# Patient Record
Sex: Female | Born: 1970 | Race: White | Hispanic: No | Marital: Single | Smoking: Never smoker
Health system: Southern US, Community
[De-identification: ages and names within clinical notes are randomized; demographics above are authoritative.]

## PROBLEM LIST (undated history)

## (undated) DIAGNOSIS — I10 Essential (primary) hypertension: Secondary | ICD-10-CM

## (undated) DIAGNOSIS — Z8632 Personal history of gestational diabetes: Secondary | ICD-10-CM

## (undated) DIAGNOSIS — N200 Calculus of kidney: Secondary | ICD-10-CM

## (undated) DIAGNOSIS — R7401 Elevation of levels of liver transaminase levels: Secondary | ICD-10-CM

## (undated) DIAGNOSIS — Z87442 Personal history of urinary calculi: Secondary | ICD-10-CM

## (undated) DIAGNOSIS — R3129 Other microscopic hematuria: Secondary | ICD-10-CM

## (undated) DIAGNOSIS — N201 Calculus of ureter: Secondary | ICD-10-CM

## (undated) DIAGNOSIS — K5909 Other constipation: Secondary | ICD-10-CM

## (undated) DIAGNOSIS — E213 Hyperparathyroidism, unspecified: Secondary | ICD-10-CM

## (undated) DIAGNOSIS — K219 Gastro-esophageal reflux disease without esophagitis: Secondary | ICD-10-CM

## (undated) DIAGNOSIS — E559 Vitamin D deficiency, unspecified: Secondary | ICD-10-CM

## (undated) DIAGNOSIS — R1084 Generalized abdominal pain: Secondary | ICD-10-CM

## (undated) HISTORY — DX: Hyperparathyroidism, unspecified: E21.3

## (undated) HISTORY — DX: Personal history of gestational diabetes: Z86.32

## (undated) HISTORY — DX: Elevation of levels of liver transaminase levels: R74.01

## (undated) HISTORY — DX: Generalized abdominal pain: R10.84

## (undated) HISTORY — DX: Personal history of urinary calculi: Z87.442

## (undated) HISTORY — DX: Gastro-esophageal reflux disease without esophagitis: K21.9

## (undated) HISTORY — DX: Essential (primary) hypertension: I10

## (undated) HISTORY — DX: Vitamin D deficiency, unspecified: E55.9

## (undated) HISTORY — PX: BREAST ENHANCEMENT SURGERY: SHX7

## (undated) HISTORY — DX: Other microscopic hematuria: R31.29

## (undated) HISTORY — PX: OTHER SURGICAL HISTORY: SHX169

## (undated) HISTORY — PX: RENAL ENDOSCOPY VIA NEPHROSTOMY / PYELOSTOMY: SUR446

---

## 1998-03-24 ENCOUNTER — Inpatient Hospital Stay (HOSPITAL_COMMUNITY): Admission: AD | Admit: 1998-03-24 | Discharge: 1998-04-13 | Payer: Self-pay | Admitting: *Deleted

## 1998-05-27 ENCOUNTER — Encounter: Admission: RE | Admit: 1998-05-27 | Discharge: 1998-08-25 | Payer: Self-pay | Admitting: *Deleted

## 1998-06-16 ENCOUNTER — Emergency Department (HOSPITAL_COMMUNITY): Admission: EM | Admit: 1998-06-16 | Discharge: 1998-06-16 | Payer: Self-pay | Admitting: Emergency Medicine

## 1998-09-18 ENCOUNTER — Encounter (HOSPITAL_COMMUNITY): Admission: RE | Admit: 1998-09-18 | Discharge: 1998-12-17 | Payer: Self-pay | Admitting: *Deleted

## 1999-01-14 ENCOUNTER — Encounter (HOSPITAL_COMMUNITY): Admission: RE | Admit: 1999-01-14 | Discharge: 1999-04-14 | Payer: Self-pay | Admitting: *Deleted

## 1999-02-13 ENCOUNTER — Other Ambulatory Visit: Admission: RE | Admit: 1999-02-13 | Discharge: 1999-02-13 | Payer: Self-pay | Admitting: Obstetrics and Gynecology

## 2000-04-19 ENCOUNTER — Encounter (INDEPENDENT_AMBULATORY_CARE_PROVIDER_SITE_OTHER): Payer: Self-pay | Admitting: *Deleted

## 2000-04-19 LAB — CONVERTED CEMR LAB

## 2001-05-18 ENCOUNTER — Encounter: Admission: RE | Admit: 2001-05-18 | Discharge: 2001-05-18 | Payer: Self-pay | Admitting: Family Medicine

## 2001-06-08 ENCOUNTER — Encounter: Admission: RE | Admit: 2001-06-08 | Discharge: 2001-06-08 | Payer: Self-pay | Admitting: Family Medicine

## 2001-07-04 ENCOUNTER — Encounter: Admission: RE | Admit: 2001-07-04 | Discharge: 2001-07-04 | Payer: Self-pay | Admitting: Family Medicine

## 2001-09-14 ENCOUNTER — Other Ambulatory Visit: Admission: RE | Admit: 2001-09-14 | Discharge: 2001-09-14 | Payer: Self-pay | Admitting: *Deleted

## 2001-12-20 HISTORY — PX: BREAST ENHANCEMENT SURGERY: SHX7

## 2002-01-31 ENCOUNTER — Inpatient Hospital Stay (HOSPITAL_COMMUNITY): Admission: AD | Admit: 2002-01-31 | Discharge: 2002-01-31 | Payer: Self-pay | Admitting: *Deleted

## 2002-04-13 ENCOUNTER — Inpatient Hospital Stay (HOSPITAL_COMMUNITY): Admission: AD | Admit: 2002-04-13 | Discharge: 2002-04-16 | Payer: Self-pay | Admitting: *Deleted

## 2002-04-13 ENCOUNTER — Encounter (INDEPENDENT_AMBULATORY_CARE_PROVIDER_SITE_OTHER): Payer: Self-pay

## 2004-08-26 ENCOUNTER — Other Ambulatory Visit: Admission: RE | Admit: 2004-08-26 | Discharge: 2004-08-26 | Payer: Self-pay | Admitting: Obstetrics and Gynecology

## 2005-06-21 ENCOUNTER — Emergency Department (HOSPITAL_COMMUNITY): Admission: EM | Admit: 2005-06-21 | Discharge: 2005-06-21 | Payer: Self-pay | Admitting: Emergency Medicine

## 2005-09-08 ENCOUNTER — Emergency Department (HOSPITAL_COMMUNITY): Admission: EM | Admit: 2005-09-08 | Discharge: 2005-09-08 | Payer: Self-pay | Admitting: Emergency Medicine

## 2005-11-06 ENCOUNTER — Inpatient Hospital Stay (HOSPITAL_COMMUNITY): Admission: EM | Admit: 2005-11-06 | Discharge: 2005-11-08 | Payer: Self-pay | Admitting: Emergency Medicine

## 2005-11-26 ENCOUNTER — Ambulatory Visit (HOSPITAL_BASED_OUTPATIENT_CLINIC_OR_DEPARTMENT_OTHER): Admission: RE | Admit: 2005-11-26 | Discharge: 2005-11-26 | Payer: Self-pay | Admitting: Urology

## 2005-11-26 HISTORY — PX: OTHER SURGICAL HISTORY: SHX169

## 2006-05-01 ENCOUNTER — Emergency Department (HOSPITAL_COMMUNITY): Admission: EM | Admit: 2006-05-01 | Discharge: 2006-05-01 | Payer: Self-pay | Admitting: Emergency Medicine

## 2007-02-17 ENCOUNTER — Encounter (INDEPENDENT_AMBULATORY_CARE_PROVIDER_SITE_OTHER): Payer: Self-pay | Admitting: *Deleted

## 2007-03-16 ENCOUNTER — Ambulatory Visit: Payer: Self-pay | Admitting: Urology

## 2007-10-31 ENCOUNTER — Emergency Department (HOSPITAL_COMMUNITY): Admission: EM | Admit: 2007-10-31 | Discharge: 2007-10-31 | Payer: Self-pay | Admitting: Emergency Medicine

## 2007-12-21 HISTORY — PX: COLONOSCOPY: SHX174

## 2008-06-25 ENCOUNTER — Ambulatory Visit: Payer: Self-pay | Admitting: Gastroenterology

## 2008-06-25 DIAGNOSIS — K5900 Constipation, unspecified: Secondary | ICD-10-CM | POA: Insufficient documentation

## 2008-06-25 DIAGNOSIS — R5383 Other fatigue: Secondary | ICD-10-CM

## 2008-06-25 DIAGNOSIS — R5381 Other malaise: Secondary | ICD-10-CM | POA: Insufficient documentation

## 2008-06-25 LAB — CONVERTED CEMR LAB
T4, Total: 9.4 ug/dL (ref 5.0–12.5)
TSH: 1.29 microintl units/mL (ref 0.35–5.50)

## 2008-06-26 ENCOUNTER — Ambulatory Visit: Payer: Self-pay | Admitting: Gastroenterology

## 2008-06-26 LAB — HM COLONOSCOPY

## 2010-01-14 ENCOUNTER — Emergency Department (HOSPITAL_COMMUNITY): Admission: EM | Admit: 2010-01-14 | Discharge: 2010-01-14 | Payer: Self-pay | Admitting: Emergency Medicine

## 2010-08-28 ENCOUNTER — Ambulatory Visit: Payer: Self-pay | Admitting: Gynecology

## 2010-12-20 ENCOUNTER — Emergency Department (HOSPITAL_COMMUNITY)
Admission: EM | Admit: 2010-12-20 | Discharge: 2010-12-20 | Payer: Self-pay | Source: Home / Self Care | Admitting: Emergency Medicine

## 2011-02-08 ENCOUNTER — Emergency Department (HOSPITAL_COMMUNITY): Payer: PRIVATE HEALTH INSURANCE

## 2011-02-08 ENCOUNTER — Emergency Department (HOSPITAL_COMMUNITY)
Admission: EM | Admit: 2011-02-08 | Discharge: 2011-02-08 | Disposition: A | Discharge status: Home / Self Care | Payer: PRIVATE HEALTH INSURANCE | Attending: Emergency Medicine | Admitting: Emergency Medicine

## 2011-02-08 DIAGNOSIS — R112 Nausea with vomiting, unspecified: Secondary | ICD-10-CM | POA: Insufficient documentation

## 2011-02-08 DIAGNOSIS — M545 Low back pain, unspecified: Secondary | ICD-10-CM | POA: Insufficient documentation

## 2011-02-08 DIAGNOSIS — I10 Essential (primary) hypertension: Secondary | ICD-10-CM | POA: Insufficient documentation

## 2011-02-08 DIAGNOSIS — N12 Tubulo-interstitial nephritis, not specified as acute or chronic: Secondary | ICD-10-CM | POA: Insufficient documentation

## 2011-02-08 DIAGNOSIS — E876 Hypokalemia: Secondary | ICD-10-CM | POA: Insufficient documentation

## 2011-02-08 DIAGNOSIS — R7309 Other abnormal glucose: Secondary | ICD-10-CM | POA: Insufficient documentation

## 2011-02-08 DIAGNOSIS — Z87442 Personal history of urinary calculi: Secondary | ICD-10-CM | POA: Insufficient documentation

## 2011-02-08 DIAGNOSIS — R109 Unspecified abdominal pain: Secondary | ICD-10-CM | POA: Insufficient documentation

## 2011-02-08 LAB — COMPREHENSIVE METABOLIC PANEL
ALT: 51 U/L — ABNORMAL HIGH (ref 0–35)
AST: 76 U/L — ABNORMAL HIGH (ref 0–37)
Albumin: 3.5 g/dL (ref 3.5–5.2)
Alkaline Phosphatase: 64 U/L (ref 39–117)
BUN: 13 mg/dL (ref 6–23)
CO2: 23 mEq/L (ref 19–32)
Calcium: 8.2 mg/dL — ABNORMAL LOW (ref 8.4–10.5)
Creatinine, Ser: 0.84 mg/dL (ref 0.4–1.2)
GFR calc Af Amer: >60 mL/min (ref >60)
GFR calc non Af Amer: >60 mL/min (ref >60)
Glucose, Bld: 154 mg/dL — ABNORMAL HIGH (ref 70–99)
Sodium: 133 mEq/L — ABNORMAL LOW (ref 135–145)
Total Bilirubin: 1.9 mg/dL — ABNORMAL HIGH (ref 0.3–1.2)
Total Protein: 6.9 g/dL (ref 6.0–8.3)

## 2011-02-08 LAB — URINALYSIS, ROUTINE W REFLEX MICROSCOPIC
Bilirubin Urine: NEGATIVE
Ketones, ur: NEGATIVE mg/dL
Nitrite: NEGATIVE
Protein, ur: 100 mg/dL — AB
Specific Gravity, Urine: 1.019 (ref 1.005–1.030)
Urine Glucose, Fasting: NEGATIVE mg/dL
pH: 6 (ref 5.0–8.0)

## 2011-02-08 LAB — DIFFERENTIAL
Basophils Absolute: 0 10*3/uL (ref 0.0–0.1)
Basophils Relative: 0 % (ref 0–1)
Eosinophils Absolute: 0 10*3/uL (ref 0.0–0.7)
Eosinophils Relative: 0 % (ref 0–5)
Lymphocytes Relative: 4 % — ABNORMAL LOW (ref 12–46)
Lymphs Abs: 0.3 10*3/uL — ABNORMAL LOW (ref 0.7–4.0)
Monocytes Absolute: 0.1 10*3/uL (ref 0.1–1.0)
Monocytes Relative: 1 % — ABNORMAL LOW (ref 3–12)
Neutro Abs: 7.5 10*3/uL (ref 1.7–7.7)
Neutrophils Relative %: 96 % — ABNORMAL HIGH (ref 43–77)

## 2011-02-08 LAB — URINE MICROSCOPIC-ADD ON

## 2011-02-08 LAB — CBC
Hemoglobin: 11.1 g/dL — ABNORMAL LOW (ref 12.0–15.0)
MCH: 29.2 pg (ref 26.0–34.0)
MCHC: 31.1 g/dL (ref 30.0–36.0)
MCV: 93.9 fL (ref 78.0–100.0)
Platelets: 201 10*3/uL (ref 150–400)
RDW: 13.1 % (ref 11.5–15.5)
WBC: 7.9 10*3/uL (ref 4.0–10.5)

## 2011-02-08 LAB — LIPASE, BLOOD: Lipase: 20 U/L (ref 11–59)

## 2011-02-08 LAB — POCT PREGNANCY, URINE: Preg Test, Ur: NEGATIVE

## 2011-02-08 MED ORDER — IOHEXOL 300 MG/ML  SOLN
100.0000 mL | Freq: Once | INTRAMUSCULAR | Status: AC | PRN
  Administered 2011-02-08: 100 mL via INTRAVENOUS

## 2011-02-10 LAB — URINE CULTURE: Colony Count: >=100000

## 2011-03-07 LAB — CBC
HCT: 39.4 % (ref 36.0–46.0)
Hemoglobin: 13.5 g/dL (ref 12.0–15.0)
MCHC: 34.2 g/dL (ref 30.0–36.0)
MCV: 92.3 fL (ref 78.0–100.0)
Platelets: 262 10*3/uL (ref 150–400)
RBC: 4.27 MIL/uL (ref 3.87–5.11)
RDW: 13.2 % (ref 11.5–15.5)
WBC: 12.1 10*3/uL — ABNORMAL HIGH (ref 4.0–10.5)

## 2011-03-07 LAB — URINE MICROSCOPIC-ADD ON

## 2011-03-07 LAB — URINALYSIS, ROUTINE W REFLEX MICROSCOPIC
Bilirubin Urine: NEGATIVE
Glucose, UA: NEGATIVE mg/dL
Ketones, ur: NEGATIVE mg/dL
Nitrite: NEGATIVE
Protein, ur: NEGATIVE mg/dL
Specific Gravity, Urine: 1.012 (ref 1.005–1.030)
Urobilinogen, UA: 4 mg/dL — ABNORMAL HIGH (ref 0.0–1.0)
pH: 7 (ref 5.0–8.0)

## 2011-03-07 LAB — BASIC METABOLIC PANEL
BUN: 7 mg/dL (ref 6–23)
CO2: 26 mEq/L (ref 19–32)
Calcium: 8.3 mg/dL — ABNORMAL LOW (ref 8.4–10.5)
Chloride: 97 mEq/L (ref 96–112)
Creatinine, Ser: 0.76 mg/dL (ref 0.4–1.2)
GFR calc Af Amer: >60 mL/min (ref >60)
GFR calc non Af Amer: >60 mL/min (ref >60)
Glucose, Bld: 102 mg/dL — ABNORMAL HIGH (ref 70–99)
Potassium: 3.2 mEq/L — ABNORMAL LOW (ref 3.5–5.1)
Sodium: 132 mEq/L — ABNORMAL LOW (ref 135–145)

## 2011-03-07 LAB — DIFFERENTIAL
Basophils Absolute: 0 10*3/uL (ref 0.0–0.1)
Basophils Relative: 0 % (ref 0–1)
Eosinophils Absolute: 0 10*3/uL (ref 0.0–0.7)
Monocytes Absolute: 0.8 10*3/uL (ref 0.1–1.0)
Neutro Abs: 9.9 10*3/uL — ABNORMAL HIGH (ref 1.7–7.7)

## 2011-03-07 LAB — URINE CULTURE: Colony Count: >=100000

## 2011-05-07 NOTE — Op Note (Signed)
[facility]  Patient:    [name], [name] Visit Number: [id number] MRN: [id number]          Service Type: OBS Location: 910A 9115 01 Attending Physician:  Pleas Koch Dictated by:   Georgina Peer, M.D. Proc. Date: 04/13/02 Admit Date:  04/13/2002                             Operative Report  PREOPERATIVE DIAGNOSES:       Intrauterine pregnancy at 39 weeks, previous cesarean section, desires repeat cesarean section, postpartum tubal ligation, compensated anemia.  POSTOPERATIVE DIAGNOSES:      Intrauterine pregnancy at 39 weeks, previous cesarean section, desires repeat cesarean section, postpartum tubal ligation, compensated anemia.  OPERATION PERFORMED:          Low cervical transverse repeat cesarean section and postpartum tubal ligation.  Schetter:                      Georgina Peer, M.D.  ASSISTANT:                    Rudy Jew. Ashley Royalty, M.D.  ANESTHESIA:                   Spinal-Dr. Arby Barrette.  ESTIMATED BLOOD LOSS:         700 cc.  FINDINGS:                     An 8 pound 14 ounce female at 9:32 a.m.  Apgars 8 and 9.  Tubes and ovaries normal.  INDICATIONS:                  A 40 year old gravida 4, para 1-1-1-2 at 39 weeks declined VBAC in favor of repeat cesarean section, postpartum tubal ligation.  She understood risks and complications of repeat surgery including bleeding, infection, damage to bowel, bladder, or vascular structures, increased thrombotic risk, and adhesions.  She accepted these and was willing to proceed.  The understood the risks and complications of tubal ligation including the 3-4 per 1000 lifetime risk of tubal ligation failure by any method.  DESCRIPTION OF PROCEDURE:     Patient was taken to the operating room, given a spinal anesthetic by Dr. Arby Barrette, placed in the dorsal supine left lateral uterine displacement position.  She was prepped and draped in the normal sterile fashion.  Foley catheter  placed in her bladder.  Transverse skin incision was made through the previous incision and taken down to the peritoneal cavity in the normal Pfannenstiel manner.  Bleeders were cauterized.  Transverse bladder flap was created.  Transverse uterine incision made.  The clear fluid ______ through the incision and it was widened with bandage scissors.  At 9:32 female infant was delivered with the aid of fundal pressure.  There were no observable anomalies.  Infant cried spontaneously. Mouth and nose were suctioned.  Cord was doubly clamped and cut and the infant was handed to the pediatric team in attendance.  Cord blood was taken.  Apgars were 8 and 9.  Weight was 8 pounds 14 ounces.  Placenta and membranes were removed.  The uterus was without extension and closed and one layer of running locked Vicryl with individual sutures of Vicryl over the incisional area to control bleeding.  There was excellent hemostasis.  Attention was taken to the tubes.  The left tube was identified  at its cornua and traced to its fimbriated end.  A mid portion of knuckle was elevated and two plain sutures of 2-0 plain were placed across the knuckle of tube and the mid portion excised.  Both lumens identified.  On the right side the tube was identified, traced to its fimbriated end.  A mesosalpingeal vessel was large and coursing under the tube.  Therefore, a modified Parkland procedure was done by identifying an avascular portion of the mesosalpinx and making a cautery hole in this, placing two sutures 3 cm apart and tying then securely and excising the mid portion of tube.  There was excellent hemostasis.  At this time the uterine incision was inspected and found to be dry.  It was irrigated and all blood and debris removed.  Bladder flap was also found to be dry.  The under side of the muscle fascia and peritoneum was found to be dry.  The fascia was then closed with Vicryl suture.  The subcutaneous tissue was  closed with Vicryl suture.  Skin was closed with 4-0 Monocryl subcuticular suture.  The patient received 1 g of Ancef after the cord was clamped.  Sponge, needle, and instrument counts were correct.  The patient began the case with a hemoglobin of 8.9.  It was 9.9 approximately one month ago in spite of iron therapy.  She should be watched postoperatively for signs of symptomatic anemia. Dictated by:   Georgina Peer, M.D. Attending Physician:  Pleas Koch DD:  04/13/02 TD:  04/13/02 Job: [id number]

## 2011-05-07 NOTE — Op Note (Signed)
[name], [name]               ACCOUNT NO.:  [id number]   MEDICAL RECORD NO.:  [id number]          PATIENT TYPE:  AMB   LOCATION:  [facility]                         FACILITY:  [facility]   PHYSICIAN:  Mark C. Vernie Ammons, M.D.  DATE OF BIRTH:  1971/10/06   DATE OF PROCEDURE:  11/26/2005  DATE OF DISCHARGE:                                 OPERATIVE REPORT   PREOPERATIVE DIAGNOSES:  Right ureteral calculus.   POSTOPERATIVE DIAGNOSES:  Right ureteral calculus.   PROCEDURE:  Cystoscopy, right ureteroscopy with laser lithotripsy and double-  J stent placement.   Amodio:  Mark C. Vernie Ammons, M.D.   ANESTHESIA:  General.   SPECIMENS:  Stone to patient.   ESTIMATED BLOOD LOSS:  Minimal.   DRAINS:  4.7 French 24 cm double-J stent with string   COMPLICATIONS:  None.   INDICATIONS:  The patient is a 40 year old white female with a 7 mm stone in  her distal right ureter. It originally had resulted in obstruction and  pyelonephritis. She had a stent placed emergently and was treated with a  full course of antibiotics. She now returns for treatment of her stone. The  risks, complications and alternatives have been discussed with the patient.  She understands and has elected to proceed.   DESCRIPTION OF OPERATION:  After informed consent, the patient was brought  to the major OR, placed on the table, administered general anesthesia and  then moved to the dorsal position. Her genitalia was sterilely prepped and  draped. A 22-French cystoscope with a 12 degree lens was inserted in the  bladder. The stent was identified and grasped with alligator graspers and  drawn part way out the urethral meatus. A 0.038 inch floppy tip guidewire  was then passed up the stent and into the area of the right renal pelvis  under direct fluoroscopic control and the stent was removed.   Next to the guidewire, a 6-French short rigid ureteroscope was then passed  under direct visualization and the stone was identified.  I then removed the  guidewire and used the 360 micron holmium laser fiber to fragment the stone.  Once the stone was completely fragmented, the nitinol basket was then used  to retrieve all of the bits of the stone.   I then reinserted the guidewire under fluoroscopic control and removed the  ureteroscope. Backloading the cystoscope over the guidewire, I passed a  double-J stent over the guidewire into the renal pelvis. As the guidewire  was removed, good curl was noted in the renal pelvis and in the bladder. The  stones were collected and will be given to the patient. A string was left  affixed to the end of the stent and was affixed to the inner thigh. The  patient was  awakened and taken to the recovery room in stable satisfactory condition.  She tolerated the procedure well. There were no intraoperative  complications. She did receive a B&O suppository intraoperatively and will  be discharged home from the recovery room with instructions and follow-up  the first of next week to have her stent removed.  Mark C. Vernie Ammons, M.D.  Electronically Signed     MCO/MEDQ  D:  11/26/2005  T:  11/26/2005  Job:  [id number]

## 2011-05-07 NOTE — Op Note (Signed)
NAMEILLANA, [name]               ACCOUNT NO.:  [id number]   MEDICAL RECORD NO.:  [id number]          PATIENT TYPE:  INP   LOCATION:  1429                         FACILITY:  [facility]   PHYSICIAN:  Mark C. Vernie Ammons, M.D.  DATE OF BIRTH:  1971-12-11   DATE OF PROCEDURE:  11/06/2005  DATE OF DISCHARGE:                                 OPERATIVE REPORT   PREOPERATIVE DIAGNOSES:  1.  Right ureteral calculus.  2.  Hydronephrosis.  3.  Pyelonephritis.   POSTOPERATIVE DIAGNOSES:  1.  Right ureteral calculus.  2.  Hydronephrosis.  3.  Pyelonephritis.   PROCEDURE:  Cystoscopy and double-J stent placement.   Widmayer:  Mark C. Vernie Ammons, M.D.   ANESTHESIA:  General.   BLOOD LOSS:  Zero.   DRAINS:  6-French 24 cm double-J stent in the right ureter (no string).   SPECIMENS:  Urine for culture.   COMPLICATIONS:  None.   INDICATIONS:  The patient is a 40 year old white female who began to have  pain in her right lower quadrant 24 hours ago. It has progressed and  associated with fever with 104, chills, nausea and vomiting. She was found  on CT scan to have marked hydronephrosis on the right-hand side down to a  large stone at the UVJ measuring approximately 8 x 7 x 6 mm. Her urinalysis  was nitrite positive with 7-10 white cells and her white count was elevated  to 17.9. The need for emergent stent placement was discussed with the  patient, she understands and elected to proceed.   DESCRIPTION OF OPERATION:  After informed consent, the patient was brought  to the major OR, placed on the table, administered general anesthesia and  then moved to the dorsal lithotomy position. Her genitalia was sterilely  prepped and draped and a 21-French cystoscope with a 12 degree lens was  inserted in the bladder. The bladder was noted be free of any tumor, stones  or inflammatory lesions.   The right ureteral orifice was identified and a 0.038 inch floppy-tip  Glidewire was then passed up the right  ureter easily pass the stone and into  the area of the renal pelvis. I then passed the stent pusher over the  guidewire past the stone and collected urine for culture. The guidewire was  then replaced and the double-J stent passed over the guidewire into the  renal pelvis. As the guidewire was removed, good curl was noted in the renal  pelvis and then in the bladder. The bladder was completely drained, the  cystoscope was removed and a B&O suppository was administered.   The patient received 3 grams of Unasyn and 80 mg of gentamicin  preoperatively. She will be maintained on broad-spectrum antibiotics  postoperatively and observed in the hospital.      Loraine Leriche C. Vernie Ammons, M.D.  Electronically Signed     MCO/MEDQ  D:  11/06/2005  T:  11/08/2005  Job:  [id number]

## 2011-05-07 NOTE — Discharge Summary (Signed)
[facility]  Patient:    [name], [name] Visit Number: [id number] MRN: [id number]          Service Type: OBS Location: 910A 9115 01 Attending Physician:  Pleas Koch Dictated by:   Georgina Peer, M.D. Admit Date:  04/13/2002 Discharge Date: 04/16/2002                             Discharge Summary  ADMISSION DIAGNOSES:          1. Term intrauterine pregnancy.                               2. Previous cesarean section.                               3. Desires sterilization.  DISCHARGE DIAGNOSES:          1. Term intrauterine pregnancy.                               2. Previous cesarean section.                               3. Desires sterilization.                               4. Compensated anemia.  HISTORY OF PRESENT ILLNESS:   The patient is a 40 year old female with a previous C section, desired repeat C section and postpartum tubal ligation. See History & Physical for details.  HOSPITAL COURSE:              She was admitted and underwent a repeat low transverse cesarean section and postpartum tubal ligation with an 8 pound 14 ounce female infant born.  The operation was uneventful.  Her preoperative hemoglobin was 8.9, postoperative was 7.5 on the first day and 7.3 on the second day.  She had no dizziness or lightheadedness, no excessive bleeding.  She was nursing without difficulty.  She ambulated without problems.  She was switched to p.o. pain medicine on the first day.  She passed gas and voided on the first day.  Her diet was advanced to regular on the first day.  On the second day, she was eating full regular diet and ambulating and voiding.  She again was not lightheaded or dizzy.  She continued to nurse well.  Her wound was clean and dry.  She was discharged on the third postoperative day with a normal exam, afebrile, with normal vital signs.  DISCHARGE MEDICATIONS: She was given a prescription for Darvocet-N 100 1 to  2 q.4-6h. p.r.n., iron which she had at home, Paxil 30 mg daily which she took during the pregnancy, and prenatal vitamins.  FOLLOWUP:  She will follow up in the office in two weeks.  DISCHARGE INSTRUCTIONS:  She was given a discharge booklet with specific instructions on postpartum concerns and postoperative routines. Dictated by:   Georgina Peer, M.D. Attending Physician:  Pleas Koch DD:  04/16/02 TD:  04/16/02 Job: [id number]

## 2011-05-07 NOTE — Discharge Summary (Signed)
NAMEKEREN, ALVERIO               ACCOUNT NO.:  [id number]   MEDICAL RECORD NO.:  [id number]          PATIENT TYPE:  INP   LOCATION:  1429                         FACILITY:  [facility]   PHYSICIAN:  Mark C. Vernie Ammons, M.D.  DATE OF BIRTH:  1971-06-24   DATE OF ADMISSION:  11/06/2005  DATE OF DISCHARGE:  11/08/2005                                 DISCHARGE SUMMARY   PRIMARY DIAGNOSES:  1.  Right pyelonephritis.  2.  Right hydronephrosis.  3.  Right ureteral calculus.  4.  Renal cysts.   PRINCIPAL OPERATION:  Cystoscopy and right double-J stent placement.   DISPOSITION:  The patient is discharged home in stable, satisfactory, and  improved condition.  She is afebrile, tolerating a regular diet.  She has  had some mild irritative symptoms which will be managed with  anticholinergics.   FOLLOW UP:  Her followup will be in my office in one week.   DISCHARGE MEDICATIONS:  1.  Darvocet one-two q.4h. p.r.n. pain #38 no refill.  2.  Vesicare 10 mg one daily p.r.n. bladder irritation #24 no refill.   DIET/ACTIVITY:  Unrestricted as well as her activity.   BRIEF HISTORY:  The patient is a 40 year old white female who was seen in  the emergency room with a temperature to 100.4 and right flank pain,  which  was mild but she was also having significant right lower quadrant pain.  It  was of sudden onset and associated with fever, chills, nausea, and vomiting.  She had no prior history of stones.  She had a similar episode about a month  ago, which resolved spontaneously.  She has been unable to keep anything  down by mouth, is afebrile, and has not seen any hematuria or pain in her  voiding pattern.  The remainder of her health history is noted in the  hospital chart and will not be repeated at this time.   Upon arrival to the emergency room laboratory results and a CT scan were  obtained.  Her urine had 7-10 white cells, it was nitrite positive, and 0-2  red cells were seen.  Her white count  was 17.9 with an H&H of 12.8 and 37.6.  Her electrolytes were normal with a slightly low sodium.  Her creatinine was  1.2.  Her SGOT and SGPT were slightly elevated at 61 and 83 respectively.  She also had a CT scan that revealed a 6x 7 x 8 mm stone just above the  right UVJ.  There was marked hydronephrosis and what appeared to be some  small renal cysts.  However, the radiologist felt that these could possibly  represent abscesses.  She also had a right renal calculus as well.   She was taken to the operating room emergently and had a right double-J  stent placed.  The urine was cultured when she arrived in the emergency  room.  I also obtained a culture from above the stone her hydronephrotic  ureter.  She was started on broad-spectrum antibiotics with Zosyn and  gentamicin and promptly defervesced.  Her white count, though, did peak at  22.3 the following day. Creatinine remained stable at 1.2 and her SGOT  decreased to 51, SGPT to 34.  She continued to do well and her Foley was  eventually removed.  By her second postoperative day she had no complaints.  She had no fever for 48 hours.  Her vital signs were stable.  Her creatinine  was now 1.0.  Her white count had decreased to 18.1 and she had no flank  pain, although she had some irritative voiding symptoms.  Her urine culture  continued to remain pending.  A renal ultrasound was checked and revealed  significant decrease in the right hydronephrosis in the areas that were felt  to be possible cysts were confirmed to be simple cysts and not absences.  She, therefore, felt ready to be discharged at this time despite the fact  that her culture remained pending.   PLAN:  The plan will be to discharge her home with Levaquin 750 mg.  She is  going to take a single dose now and then in the morning.  I will follow up  on her cultures and contact her tomorrow with the culture results and adjust  antibiotics accordingly treating based on  sensitivities.  She is having some  irritative symptoms and I have therefore given her a prescription for  Vesicare for that.  She will take that on an as needed basis to be seen as  noted above.      Mark C. Vernie Ammons, M.D.  Electronically Signed     MCO/MEDQ  D:  11/08/2005  T:  11/09/2005  Job:  [id number]

## 2011-08-05 ENCOUNTER — Encounter (HOSPITAL_COMMUNITY): Payer: Self-pay

## 2011-08-05 ENCOUNTER — Inpatient Hospital Stay (HOSPITAL_COMMUNITY): Payer: PRIVATE HEALTH INSURANCE

## 2011-08-05 ENCOUNTER — Inpatient Hospital Stay (HOSPITAL_COMMUNITY)
Admission: EM | Admit: 2011-08-05 | Discharge: 2011-08-09 | DRG: 872 | Disposition: A | Discharge status: Home / Self Care | Payer: PRIVATE HEALTH INSURANCE | Attending: Urology | Admitting: Urology

## 2011-08-05 ENCOUNTER — Emergency Department (HOSPITAL_COMMUNITY): Payer: PRIVATE HEALTH INSURANCE

## 2011-08-05 DIAGNOSIS — A419 Sepsis, unspecified organism: Secondary | ICD-10-CM | POA: Diagnosis present

## 2011-08-05 DIAGNOSIS — Z87442 Personal history of urinary calculi: Secondary | ICD-10-CM

## 2011-08-05 DIAGNOSIS — N390 Urinary tract infection, site not specified: Secondary | ICD-10-CM | POA: Diagnosis present

## 2011-08-05 DIAGNOSIS — I10 Essential (primary) hypertension: Secondary | ICD-10-CM | POA: Diagnosis present

## 2011-08-05 DIAGNOSIS — IMO0002 Reserved for concepts with insufficient information to code with codable children: Secondary | ICD-10-CM | POA: Diagnosis present

## 2011-08-05 DIAGNOSIS — N201 Calculus of ureter: Secondary | ICD-10-CM | POA: Diagnosis present

## 2011-08-05 DIAGNOSIS — A4159 Other Gram-negative sepsis: Principal | ICD-10-CM | POA: Diagnosis present

## 2011-08-05 DIAGNOSIS — R0602 Shortness of breath: Secondary | ICD-10-CM

## 2011-08-05 HISTORY — PX: OTHER SURGICAL HISTORY: SHX169

## 2011-08-05 LAB — DIFFERENTIAL
Basophils Absolute: 0 10*3/uL (ref 0.0–0.1)
Lymphocytes Relative: 2 % — ABNORMAL LOW (ref 12–46)
Lymphs Abs: 0.3 10*3/uL — ABNORMAL LOW (ref 0.7–4.0)
Neutro Abs: 12 10*3/uL — ABNORMAL HIGH (ref 1.7–7.7)
Neutrophils Relative %: 94 % — ABNORMAL HIGH (ref 43–77)

## 2011-08-05 LAB — URINALYSIS, ROUTINE W REFLEX MICROSCOPIC
Bilirubin Urine: NEGATIVE
Ketones, ur: NEGATIVE mg/dL
Protein, ur: 100 mg/dL — AB
Urobilinogen, UA: 0.2 mg/dL (ref 0.0–1.0)

## 2011-08-05 LAB — BASIC METABOLIC PANEL
BUN: 25 mg/dL — ABNORMAL HIGH (ref 6–23)
CO2: 24 mEq/L (ref 19–32)
Chloride: 102 mEq/L (ref 96–112)
Creatinine, Ser: 1 mg/dL (ref 0.50–1.10)
GFR calc Af Amer: >60 mL/min (ref >60)
Potassium: 4 mEq/L (ref 3.5–5.1)

## 2011-08-05 LAB — URINE MICROSCOPIC-ADD ON

## 2011-08-05 LAB — CBC
HCT: 35.8 % — ABNORMAL LOW (ref 36.0–46.0)
Hemoglobin: 11.4 g/dL — ABNORMAL LOW (ref 12.0–15.0)
MCV: 91.6 fL (ref 78.0–100.0)
RBC: 3.91 MIL/uL (ref 3.87–5.11)
WBC: 12.8 10*3/uL — ABNORMAL HIGH (ref 4.0–10.5)

## 2011-08-05 LAB — MRSA PCR SCREENING: MRSA by PCR: NEGATIVE

## 2011-08-05 LAB — LACTIC ACID, PLASMA
Lactic Acid, Venous: 2.9 mmol/L — ABNORMAL HIGH (ref 0.5–2.2)
Lactic Acid, Venous: 2.1 mmol/L (ref 0.5–2.2)

## 2011-08-06 ENCOUNTER — Inpatient Hospital Stay (HOSPITAL_COMMUNITY): Payer: PRIVATE HEALTH INSURANCE

## 2011-08-06 LAB — CBC
HCT: 30 % — ABNORMAL LOW (ref 36.0–46.0)
Hemoglobin: 9.7 g/dL — ABNORMAL LOW (ref 12.0–15.0)
MCHC: 32.3 g/dL (ref 30.0–36.0)
RDW: 13.2 % (ref 11.5–15.5)
WBC: 18.1 10*3/uL — ABNORMAL HIGH (ref 4.0–10.5)

## 2011-08-06 LAB — BASIC METABOLIC PANEL
Calcium: 7.9 mg/dL — ABNORMAL LOW (ref 8.4–10.5)
GFR calc non Af Amer: >60 mL/min (ref >60)
Glucose, Bld: 118 mg/dL — ABNORMAL HIGH (ref 70–99)
Potassium: 3.7 mEq/L (ref 3.5–5.1)
Sodium: 135 mEq/L (ref 135–145)

## 2011-08-06 LAB — LACTIC ACID, PLASMA: Lactic Acid, Venous: 1.1 mmol/L (ref 0.5–2.2)

## 2011-08-06 NOTE — H&P (Signed)
[name], [name] NO.:  [id number]  MEDICAL RECORD NO.:  [id number]  LOCATION:  1233                         FACILITY:  [facility]  PHYSICIAN:  Natalia Leatherwood, MD    DATE OF BIRTH:  [birth year]  DATE OF ADMISSION:  08/05/2011 DATE OF DISCHARGE:                             HISTORY & PHYSICAL   CHIEF COMPLAINT:  Left flank pain.  HISTORY OF PRESENT ILLNESS:  This is a pleasant 40 year old female who has a history of urolithiasis.  She woke up early this morning with nausea, emesis, and left-sided flank and abdominal pain.  She presented to the [facility] ER where CT scan was obtained, which showed bilateral obstructing ureteral stones at the ureteropelvic junction.  I was consulted and was able to review the CT scan.  Upon my arrival in the ER, noted that the patient met at least 3 SIRS criteria with respiration rate of 20, tachycardia greater than 100, and a white blood cell count of 12.  After a lactate be obtained and I discussed with the patient the options for management.  Urinalysis was reviewed and it had signs of infection.  We asked that this be sent for culture and it was.  The patient has had stones before.  She said the pain she has is sharp in nature.  It is better if she lays on her right side.  Nothing seems to make it worse.  It was associated with nausea.  It was associated with emesis.  Not associated with gross hematuria.  She has had a pain similar to this before.  She denies fever associated with it.  PAST MEDICAL HISTORY: 1. Hypertension. 2. Urolithiasis.  PAST SURGICAL HISTORY: 1. C-section. 2. Tubal ligation. 3. Breast augmentation. 4. Lithotripsy.  ALLERGIES:  CODEINE, She has "throat swelling."  SOCIAL HISTORY:  She denies smoking.  Alcohol, she has 2-3 drinks per month.  She denies recreational drugs.  MEDICATIONS:  She takes over-the-counter vitamin C.  FAMILY HISTORY:  Grandmother with colon cancer.  REVIEW OF SYSTEMS:   Positive for anxiety, shortness of breath 1 month ago which did not persist, and constipation.  Further 10-point review of systems is negative.  PHYSICAL EXAMINATION:  VITAL SIGNS:  Pulse 103, systolic blood pressure of 120, respirations 20, temperature 100.7. GENERAL:  The patient in moderate distress from pain.  The patient also has rigors. HEENT:  Extraocular muscles are intact.  Normocephalic, atraumatic. CHEST:  Equal effort bilaterally.  Clear auscultation bilaterally. CARDIOVASCULAR:  Tachycardia.  Regular rhythm. ABDOMEN:  Soft.  Mild-to-moderate tenderness to palpation in the left upper quadrant. BACK:  No CVA tenderness on the right.  No CVA tenderness on the left. EXTREMITIES:  No swelling.  No gross deformities. SKIN:  No rash.  No itching. NEUROLOGIC:  She is awake.  Alert.  Oriented.  Neurologically, motor is grossly intact in bilateral upper and lower extremities.  Sensory grossly intact in bilaterally upper and lower extremities. LYMPHATICS:  No cervical lymphadenopathy.  No supraclavicular adenopathy.  ASSESSMENT:  This is a patient with obstructing stones and systemic inflammatory response syndrome criteria.  PLAN: 1. I have discussed with the patient the need for an obstruction of  her stones.  I explained the manipulation of the stones further to     try to remove them could produce worsening of her possible sepsis. 2. I explained the risks and benefits, which include, but are not     limited to bleeding, infection, allergic reaction, problems with     heart, blood pressure, death, and need for further procedure.  She     voiced understanding. 3. Informed consent was obtained and we will take her to the operating     room for ureteral stent placement.          ______________________________ Natalia Leatherwood, MD     DW/MEDQ  D:  08/05/2011  T:  08/06/2011  Job:  [id number]  Electronically Signed by Natalia Leatherwood MD on 08/06/2011 08:15:06 AM

## 2011-08-06 NOTE — Op Note (Signed)
  [name], DAPONTE NO.:  [id number]  MEDICAL RECORD NO.:  [id number]  LOCATION:  1233                         FACILITY:  [facility]  PHYSICIAN:  Natalia Leatherwood, MD    DATE OF BIRTH:  1971-02-04  DATE OF PROCEDURE:  08/05/2011 DATE OF DISCHARGE:                              OPERATIVE REPORT   PREOPERATIVE DIAGNOSIS:  Bilateral obstructing nephrolithiasis.  POSTOPERATIVE DIAGNOSIS:  Bilateral obstructing nephrolithiasis.  PROCEDURE PERFORMED:  Cystoscopy, bilateral ureteral stent placement.  FINDINGS:  Bilateral nephrolithiasis.  SPECIMEN:  None.  ESTIMATED BLOOD LOSS:  None.  COMPLICATIONS:  None.  HISTORY OF PRESENT ILLNESS:  This is a 40 year old female who presented to the emergency department in pain in the left abdomen.  She met SIRS criteria and had CT scan, was found to have bilateral obstructing nephrolithiasis.  Urology is consulted and I recommended ureteral stent placement to allow drainage of her kidneys.  We discussed risk and benefits which include but were not limited to bleeding, infection, sepsis, damage to contiguous structures, need for further surgery in the future, and death.  The patient voiced understanding, wishes to proceed.  PROCEDURE IN DETAIL:  After informed consent was obtained, the patient taken to the operating room, where she was placed supine position.  IV antibiotics had been given in the ER prior to her being taken to the OR.  General anesthesia was induced and then she was placed in dorsal lithotomy position, making sure to pad all pertinent neurovascular pressure points.  Her genitals were then prepped and draped in usual fashion.  Cystoscope was advanced through the urethra into the bladder.  The urine was very cloudy and this was drained and then a Sensor tip wire was placed into the right ureteral orifice and placed up past the stone into the right kidney under fluoroscopy.  A 6 x 24 double-J ureteral stent was  placed up over the wire with ease and deployed with a good curl in the right kidney and in the bladder.  Attention was then turned to the left ureteral orifice, it was cannulated with a Sensor tip wire and this was placed with a good curl past the stone into the left renal pelvis.  The ureteral stent was then placed.  It was a 6 x 24 double-J ureteral stent.  This was placed with ease with good curl noted in renal pelvis on the left on fluoroscopy and in the bladder. The bladder was then drained.  A 10 cc of lidocaine jelly was placed in the urethra and then a 16-French Foley catheter was placed.  This completed the procedure.  Anesthesia was reversed.  She was placed back in supine position and taken to the PACU in stable condition.  I will contact critical care service for additional help managing this patient who meets SIRS criteria.          ______________________________ Natalia Leatherwood, MD     DW/MEDQ  D:  08/05/2011  T:  08/05/2011  Job:  [id number]  Electronically Signed by Natalia Leatherwood MD on 08/06/2011 08:14:07 AM

## 2011-08-07 LAB — CBC
HCT: 30 % — ABNORMAL LOW (ref 36.0–46.0)
Hemoglobin: 9.7 g/dL — ABNORMAL LOW (ref 12.0–15.0)
MCH: 30.1 pg (ref 26.0–34.0)
MCHC: 32.3 g/dL (ref 30.0–36.0)
MCV: 93.2 fL (ref 78.0–100.0)
RBC: 3.22 MIL/uL — ABNORMAL LOW (ref 3.87–5.11)

## 2011-08-07 LAB — COMPREHENSIVE METABOLIC PANEL
ALT: 16 U/L (ref 0–35)
BUN: 8 mg/dL (ref 6–23)
CO2: 24 mEq/L (ref 19–32)
Calcium: 8.2 mg/dL — ABNORMAL LOW (ref 8.4–10.5)
Creatinine, Ser: 1.03 mg/dL (ref 0.50–1.10)
GFR calc Af Amer: >60 mL/min (ref >60)
GFR calc non Af Amer: 60 mL/min — ABNORMAL LOW (ref >60)
Glucose, Bld: 148 mg/dL — ABNORMAL HIGH (ref 70–99)
Total Protein: 5.9 g/dL — ABNORMAL LOW (ref 6.0–8.3)

## 2011-08-07 LAB — URINE CULTURE: Culture  Setup Time: 201208170038

## 2011-08-08 LAB — GENTAMICIN LEVEL, PEAK: Gentamicin Pk: 4.8 ug/mL — ABNORMAL LOW (ref 5.0–10.0)

## 2011-08-08 LAB — BASIC METABOLIC PANEL
BUN: 5 mg/dL — ABNORMAL LOW (ref 6–23)
Chloride: 104 mEq/L (ref 96–112)
GFR calc non Af Amer: >60 mL/min (ref >60)
Glucose, Bld: 112 mg/dL — ABNORMAL HIGH (ref 70–99)
Potassium: 3.3 mEq/L — ABNORMAL LOW (ref 3.5–5.1)
Sodium: 136 mEq/L (ref 135–145)

## 2011-08-08 LAB — CBC
MCH: 29.6 pg (ref 26.0–34.0)
MCHC: 32.4 g/dL (ref 30.0–36.0)
Platelets: 199 10*3/uL (ref 150–400)
RBC: 3.11 MIL/uL — ABNORMAL LOW (ref 3.87–5.11)

## 2011-08-08 LAB — GENTAMICIN LEVEL, TROUGH: Gentamicin Trough: 0.9 ug/mL (ref 0.5–2.0)

## 2011-08-09 LAB — BASIC METABOLIC PANEL
BUN: 5 mg/dL — ABNORMAL LOW (ref 6–23)
CO2: 24 mEq/L (ref 19–32)
Calcium: 8.5 mg/dL (ref 8.4–10.5)
Glucose, Bld: 93 mg/dL (ref 70–99)
Potassium: 3.5 mEq/L (ref 3.5–5.1)
Sodium: 136 mEq/L (ref 135–145)

## 2011-08-09 LAB — CBC
HCT: 30.2 % — ABNORMAL LOW (ref 36.0–46.0)
Hemoglobin: 9.9 g/dL — ABNORMAL LOW (ref 12.0–15.0)
MCH: 29.5 pg (ref 26.0–34.0)
MCHC: 32.8 g/dL (ref 30.0–36.0)
RBC: 3.36 MIL/uL — ABNORMAL LOW (ref 3.87–5.11)

## 2011-08-12 LAB — CULTURE, BLOOD (ROUTINE X 2)
Culture  Setup Time: 201208172003
Culture: NO GROWTH

## 2011-08-13 NOTE — Discharge Summary (Signed)
  [name], [name] NO.:  [id number]  MEDICAL RECORD NO.:  [id number]  LOCATION:  1516                         FACILITY:  [facility]  PHYSICIAN:  Natalia Leatherwood, MD    DATE OF BIRTH:  1971-09-25  DATE OF ADMISSION:  08/05/2011 DATE OF DISCHARGE:  08/09/2011                              DISCHARGE SUMMARY   ADMISSION DIAGNOSES:  Sepsis and bilateral urinary tract obstruction with nephrolithiasis.  DISCHARGE DIAGNOSES:  Sepsis and bilateral urinary tract obstruction with nephrolithiasis.  CONSULTATIONS OBTAINED:  During this hospitalization are consultation to critical care for co-management while the patient was in the ICU due to sepsis.  PROCEDURE PERFORMED:  During this hospitalization are August 05, 2011, the patient had a cystoscopy with bilateral ureteral stent placement.  HISTORY OF PRESENT ILLNESS:  This is a pleasant female, who presented to the emergency department, who met SIRS criteria and had bilaterally obstructing stones.  Urology was consulted.  HOSPITAL COURSE:  The patient was admitted postoperatively after ureteral stents were placed.  She was admitted to the ICU due to her meeting criteria for systemic inflammatory response syndrome.  It was felt that she had early onset of sepsis and Critical Care was consulted for co-management.  The patient was in the ICU for several days requiring IV antibiotics.  She did spike several fevers and required large amount of IV fluids for supportive care.  She was initially able to be afebrile for a 24-hour period and then she was switched to oral antibiotics after being afebrile.  Also at this time, she is able to be transferred from the ICU to the floor.  The oral antibiotics were changed to Bactrim as this was what the urine culture grew out from the emergency department.  This grew out Klebsiella pneumonia.  The patient did well and is able to have her IV saline locked.  Her catheter was removed.  She  is able to void.  She is able to ambulate, control her pain with pain medications, and tolerate regular diet.  After she had been afebrile for over 24 hours while on oral antibiotics, it was felt that she could be discharged home.  DISCHARGE DISPOSITION:  Home.  DISCHARGE CONDITION:  Stable.  DISCHARGE DIET:  As before.  DISCHARGE MEDICATIONS:  She was given Bactrim Double Strength to take twice daily, hydrocodone/acetaminophen for pain, docusate sodium for stool softeners, and promethazine rectal suppositories for nausea.  She is to follow up with Dr. Margarita Grizzle at [facility] in 2 weeks' time.  She is to return to the emergency department immediately should she experience temperature to 101 or greater.  All of her questions were answered.          ______________________________ Natalia Leatherwood, MD     DW/MEDQ  D:  08/09/2011  T:  08/10/2011  Job:  [id number]  Electronically Signed by Natalia Leatherwood MD on 08/13/2011 10:18:44 PM

## 2011-08-26 ENCOUNTER — Other Ambulatory Visit: Payer: Self-pay | Admitting: Urology

## 2011-08-26 DIAGNOSIS — N200 Calculus of kidney: Secondary | ICD-10-CM

## 2011-08-30 ENCOUNTER — Other Ambulatory Visit: Payer: Self-pay | Admitting: Urology

## 2011-08-30 ENCOUNTER — Encounter (HOSPITAL_COMMUNITY): Payer: PRIVATE HEALTH INSURANCE

## 2011-08-30 LAB — CBC
HCT: 36.3 % (ref 36.0–46.0)
Hemoglobin: 11.4 g/dL — ABNORMAL LOW (ref 12.0–15.0)
MCH: 28.7 pg (ref 26.0–34.0)
MCHC: 31.4 g/dL (ref 30.0–36.0)
MCV: 91.4 fL (ref 78.0–100.0)
Platelets: 318 10*3/uL (ref 150–400)
RBC: 3.97 MIL/uL (ref 3.87–5.11)
RDW: 13.7 % (ref 11.5–15.5)
WBC: 7.3 10*3/uL (ref 4.0–10.5)

## 2011-08-30 LAB — SURGICAL PCR SCREEN
MRSA, PCR: NEGATIVE
Staphylococcus aureus: NEGATIVE

## 2011-08-30 LAB — HCG, SERUM, QUALITATIVE: Preg, Serum: NEGATIVE

## 2011-09-02 ENCOUNTER — Ambulatory Visit (HOSPITAL_COMMUNITY): Payer: PRIVATE HEALTH INSURANCE

## 2011-09-02 ENCOUNTER — Ambulatory Visit (HOSPITAL_COMMUNITY)
Admission: RE | Admit: 2011-09-02 | Discharge: 2011-09-02 | Disposition: A | Discharge status: Home / Self Care | Payer: PRIVATE HEALTH INSURANCE | Source: Ambulatory Visit | Attending: Urology | Admitting: Urology

## 2011-09-02 ENCOUNTER — Other Ambulatory Visit: Payer: Self-pay | Admitting: Urology

## 2011-09-02 ENCOUNTER — Other Ambulatory Visit (HOSPITAL_COMMUNITY): Payer: PRIVATE HEALTH INSURANCE

## 2011-09-02 DIAGNOSIS — N200 Calculus of kidney: Secondary | ICD-10-CM

## 2011-09-02 MED ORDER — IOHEXOL 300 MG/ML  SOLN
50.0000 mL | Freq: Once | INTRAMUSCULAR | Status: AC | PRN
  Administered 2011-09-02: 9 mL

## 2011-09-03 ENCOUNTER — Ambulatory Visit (HOSPITAL_COMMUNITY)
Admission: RE | Admit: 2011-09-03 | Discharge: 2011-09-04 | Disposition: A | Discharge status: Home / Self Care | Payer: PRIVATE HEALTH INSURANCE | Source: Ambulatory Visit | Attending: Urology | Admitting: Urology

## 2011-09-03 ENCOUNTER — Ambulatory Visit (HOSPITAL_COMMUNITY): Payer: PRIVATE HEALTH INSURANCE

## 2011-09-03 DIAGNOSIS — K219 Gastro-esophageal reflux disease without esophagitis: Secondary | ICD-10-CM | POA: Insufficient documentation

## 2011-09-03 DIAGNOSIS — N200 Calculus of kidney: Secondary | ICD-10-CM | POA: Insufficient documentation

## 2011-09-03 HISTORY — PX: PERCUTANEOUS NEPHROSTOLITHOTOMY: SHX2207

## 2011-09-07 NOTE — Op Note (Signed)
NAMERUBI, TOOLEY               ACCOUNT NO.:  [id number]  MEDICAL RECORD NO.:  [id number]  LOCATION:  1444                         FACILITY:  [facility]  PHYSICIAN:  Kedarius Aloisi C. Vernie Ammons, M.D.  DATE OF BIRTH:  08-31-1971  DATE OF PROCEDURE:  09/03/2011 DATE OF DISCHARGE:                              OPERATIVE REPORT   DATE OF OPERATION:  September 03, 2011.  PREOPERATIVE DIAGNOSES: 1. Right renal calculus. 2. Right double-J stent.  POSTOPERATIVE DIAGNOSES: 1. Right renal calculus. 2. Right double-J stent.  PROCEDURE: 1. Right percutaneous nephrostolithotomy (2.5 cm). 2. Removal of right double-J stent.  Esther:  Gaston Dase C. Vernie Ammons, M.D.  ANESTHESIA:  General.  SPECIMENS:  Stone, given to the patient.  DRAINS:  None.  BLOOD BLOOD LOSS:  75 cc.  COMPLICATIONS:  None.  INDICATIONS:  Patient is a 40 year old female, who was admitted to the hospital with obstructing stone and fever.  She was found to have bilateral large renal calculi and had bilateral double-J stents placed. We discussed the treatment options and she has elected to proceed with percutaneous nephrostolithotomy.  The risks, complications, and alternatives have been discussed.  DESCRIPTION OF OPERATION:  After informed consent, the patient was brought to the major OR, administered general anesthesia, and then moved to the prone position on the operating table.  Her right flank was noted to have the previously placed nephrostomy catheter exiting and was sterilely prepped and draped after which an official time-out was then performed.  I initially passed a 0.038-inch floppy-tip guidewire down the nephrostomy catheter and into the bladder under direct fluoroscopic control.  The nephrostomy catheter was removed, transverse incision was made over the guidewire and a coaxial catheter was then passed over the guidewire under fluoroscopy.  I removed the inner portion of the coaxial catheter and left the peel-away  sheath, which was used to pass a second working guidewire down the ureter in the area of the bladder again under fluoroscopy.  These were left in place and the peel-away sheath was then removed.  I then passed the UroMax nephrostomy balloon catheter over the guidewire and positioned it in the area of the renal pelvis fluoroscopically.  I then inflated the balloon and passed the 30-French nephrostomy access sheath over the balloon and into the area of the renal pelvis under fluoroscopic control.  This was left in place as the balloon was deflated and the dilating balloon was then removed leaving the working guidewire and a safety guidewire in place.  I used a 28-French rigid nephroscope passes under direct vision into the renal pelvis, then it was able to identify the stone.  I used the Swiss Lithoclast to fragment the stone and as the stone fragmented, small portions were evacuated through the suction portion of the Swiss Lithoclast.  As portions of the stone fragment broke free, I grasped these with three-pronged graspers and extracted.  This proceeded until all visible stone was gone.  I was able to visualize the entire renal pelvis as well as the upper pole without difficulty.  I removed the rigid nephroscope and inserted the 17-French flexible cystoscope and then inspected each calix including the upper middle and lower pole calyces and  found no further stones.  I was able to actually pass the cystoscope down the ureter approximately three-quarters to four or fifths to the way down the ureter noting no stone fragments or clots, but this indicated that the very large, capacious ureter.  I then removed the cystoscope and reinserted the rigid nephroscope and used the two-pronged graspers to grasp the previously placed stent.  With the two-pronged graspers, the double-J stent that was in the right ureter previously was removed.  I then reinspected the renal pelvis one last time and  noted no bleeding.  I then advanced the nephrostomy sheath back to the level of the renal parenchyma and noted no bleeding, whatsoever.  There was no clot.  The renal collecting system was noted to be intact with no perforation.  Because of that, I elected to perform the procedure using a tubeless technique.  10 cc of FloSeal were then used, then I measured the distance to the end of the nephrostomy sheath and placed the nephrostomy sheath at the edge of the renal parenchyma and then used the laparoscopic FloSeal injector to inject the FloSeal at this location and then slowly backed the sheath off out and also injected FloSeal simultaneously filling the nephrostomy tract with FloSeal.  No bleeding was noted, so I therefore closed the incision with a running subcuticular 4-0 Monocryl suture and applied a sterile gauze dressing as well as Tegaderm and the patient was awakened and taken to recovery room in stable and satisfactory condition.  She tolerated the procedure well with no intraoperative complications.  She will be observed overnight, maintained on antibiotics and with anticipated discharge in the morning.     Reginal Wojcicki C. Vernie Ammons, M.D.     MCO/MEDQ  D:  09/03/2011  T:  09/04/2011  Job:  [id number]  Electronically Signed by Ihor Gully M.D. on 09/07/2011 04:45:03 AM

## 2011-09-10 ENCOUNTER — Other Ambulatory Visit: Payer: Self-pay | Admitting: Urology

## 2011-09-10 DIAGNOSIS — N200 Calculus of kidney: Secondary | ICD-10-CM

## 2011-09-28 LAB — URINALYSIS, ROUTINE W REFLEX MICROSCOPIC
Glucose, UA: NEGATIVE
Nitrite: NEGATIVE
Specific Gravity, Urine: 1.014
pH: 7.5

## 2011-09-28 LAB — PREGNANCY, URINE: Preg Test, Ur: NEGATIVE

## 2011-09-28 LAB — URINE MICROSCOPIC-ADD ON

## 2011-10-05 ENCOUNTER — Other Ambulatory Visit: Payer: Self-pay | Admitting: Interventional Radiology

## 2011-10-05 ENCOUNTER — Encounter (HOSPITAL_COMMUNITY): Payer: PRIVATE HEALTH INSURANCE

## 2011-10-05 ENCOUNTER — Other Ambulatory Visit: Payer: Self-pay | Admitting: Urology

## 2011-10-05 LAB — CBC
MCHC: 30.9 g/dL (ref 30.0–36.0)
Platelets: 286 10*3/uL (ref 150–400)
RDW: 13.5 % (ref 11.5–15.5)
WBC: 4.4 10*3/uL (ref 4.0–10.5)

## 2011-10-05 LAB — HCG, SERUM, QUALITATIVE: Preg, Serum: NEGATIVE

## 2011-10-05 LAB — SURGICAL PCR SCREEN
MRSA, PCR: NEGATIVE
Staphylococcus aureus: NEGATIVE

## 2011-10-08 ENCOUNTER — Ambulatory Visit (HOSPITAL_COMMUNITY): Payer: PRIVATE HEALTH INSURANCE

## 2011-10-08 ENCOUNTER — Ambulatory Visit (HOSPITAL_COMMUNITY)
Admission: RE | Admit: 2011-10-08 | Discharge: 2011-10-08 | Disposition: A | Discharge status: Home / Self Care | Payer: PRIVATE HEALTH INSURANCE | Source: Ambulatory Visit | Attending: Urology | Admitting: Urology

## 2011-10-08 ENCOUNTER — Ambulatory Visit (HOSPITAL_COMMUNITY)
Admission: RE | Admit: 2011-10-08 | Discharge: 2011-10-09 | Disposition: A | Discharge status: Home / Self Care | Payer: PRIVATE HEALTH INSURANCE | Source: Ambulatory Visit | Attending: Urology | Admitting: Urology

## 2011-10-08 DIAGNOSIS — N200 Calculus of kidney: Secondary | ICD-10-CM | POA: Insufficient documentation

## 2011-10-08 DIAGNOSIS — I10 Essential (primary) hypertension: Secondary | ICD-10-CM | POA: Insufficient documentation

## 2011-10-08 DIAGNOSIS — Z01812 Encounter for preprocedural laboratory examination: Secondary | ICD-10-CM | POA: Insufficient documentation

## 2011-10-08 DIAGNOSIS — K219 Gastro-esophageal reflux disease without esophagitis: Secondary | ICD-10-CM | POA: Insufficient documentation

## 2011-10-08 HISTORY — PX: OTHER SURGICAL HISTORY: SHX169

## 2011-10-08 MED ORDER — IOHEXOL 300 MG/ML  SOLN: 20.0000 mL | Freq: Once | INTRAMUSCULAR | Status: AC | PRN

## 2011-10-08 NOTE — Op Note (Signed)
[name], [name]               ACCOUNT NO.:  [id number]  MEDICAL RECORD NO.:  [id number]  LOCATION:  1442                         FACILITY:  [facility]  PHYSICIAN:  Kessa Fairbairn C. Vernie Ammons, M.D.  DATE OF BIRTH:  [birth year]  DATE OF PROCEDURE:  10/08/2011 DATE OF DISCHARGE:                              OPERATIVE REPORT   PREOPERATIVE DIAGNOSIS:  Left renal calculus.  POSTOPERATIVE DIAGNOSIS:  Left renal calculus.  PROCEDURES: 1. Left percutaneous nephrostolithotomy (2.08 cm). 2. Removal of left ureteral stent.  [name]:  [name] C. Vernie Ammons, M.D.  ANESTHESIA:  General.  DRAINS:  None.  SPECIMENS:  Stone given to the patient.  BLOOD LOSS:  Minimal.  COMPLICATIONS:  None.  INDICATIONS:  The patient is a 40 year old female who had bilateral obstructing stones and underwent bilateral double-J stent placement to unobstruct the kidneys.  Once she was treated for her infection, she then underwent a right percutaneous nephrostolithotomy with clearance of all of her stone from the right renal pelvis.  We then discussed treatment of her left renal calculus in an identical fashion.  The risks, complications, ,alternatives limitations, and potential for probably stone free rate were discussed.  She understands and has elected to proceed.  DESCRIPTION OF OPERATION:  After informed consent, the patient was brought to the major OR, placed on the table, administered general anesthesia, and then moved to the prone position.  Her left flank was then sterilely prepped and draped including the previously placed nephrostomy catheter.  An official time-out was then performed.  I passed a 0.038 inch floppy-tipped guidewire down the ureter through the existing nephrostomy catheter into the bladder under fluoroscopic control and then removed the nephrostomy catheter.  A transverse incision was made over this guidewire and a coaxial catheter was passed over the guidewire and down the ureter under direct  fluoroscopic control.  The inner portion of the peel-away coaxial system was then removed leaving the guidewire in place, and a second guidewire was then passed through this and down the left ureter into the area of the bladder, again under fluoroscopic guidance.  With both of these in place, one was secured to the drape as a safety guidewire and the second was used as a working guidewire.  I passed the NephroMax nephrostomy dilating balloon over the guidewire and positioned it next to the stone in the area of the renal pelvis based on the previous nephrostogram. This was then inflated and the 30-French nephrostomy access sheath was passed over the inflated balloon into the area of the renal pelvis, and the balloon was deflated and removed.  The 26-French rigid offset nephroscope was then passed under direct vision down the access sheath and into the area of the renal pelvis where the stone was immediately identified.  I used the Music therapist to begin fragmenting the stone and as fragments broke from the stone, I would then extract those using a 3-prong grasper.  This proceeded until all stone fragments had been extracted completely.  I then was able to visualize the renal pelvis and noted it was free of stone material as well as the lower pole calyceal system, by which access was achieved.  I also visualized the  ureteropelvic junction and noted no stone there.  I therefore used 2-prong graspers to grasp the stent and removed that.  I then rechecked the UPJ region and there was no stone fragments present, and it was widely patent.  I elected to leave the stent out.  The working guidewire was then removed and I measured the distance to the renal capsule.  I then used fluoroscopy to check to make sure there were no stone fragments visible and none were noted.  I therefore removed the safety guidewire and then injected FloSeal using the laparoscopic injector, using 10 cc of FloSeal.  I  started at the level of the renal capsule and injected the nephrostomy tract, filling it with FloSeal and essentially preventing any bleeding.  There was minimal bleeding during the procedure and no evidence of renal pelvis perforation, and I therefore elected to proceed with a tubeless procedure.  I then closed the skin with a running subcuticular 4-0 Monocryl suture, and applied a sterile 4 x 4 and Tegaderm dressing.  The patient was awakened, and taken to recovery room in stable and satisfactory condition.  She tolerated the procedure well.  No intraoperative complications.  She will be observed overnight with anticipation of discharge in the morning.     [name] C. Vernie Ammons, M.D.     MCO/MEDQ  D:  10/08/2011  T:  10/08/2011  Job:  [id number]  Electronically Signed by Ihor Gully M.D. on 10/08/2011 06:05:31 PM

## 2011-10-14 NOTE — Discharge Summary (Signed)
  [name], [name] NO.:  [id number]  MEDICAL RECORD NO.:  [id number]  LOCATION:  1442                         FACILITY:  [facility]  PHYSICIAN:  Sebastian Ache, MD     DATE OF BIRTH:  [birth year]  DATE OF ADMISSION:  10/08/2011 DATE OF DISCHARGE:  10/09/2011                              DISCHARGE SUMMARY   ADMITTING DIAGNOSIS:  Large volume left renal stone.  DISCHARGE DIAGNOSIS:  Large volume left renal stone, status post surgical treatment.  PROCEDURES: 1. Left percutaneous nephrostolithotomy on October 08, 2011. 2. Percutaneous nephrostomy tube placement by Interventional Radiology     on October 08, 2011.  DISPOSITION:  Discharge to home.  BRIEF SUMMARY OF HISTORY AND PHYSICAL:  [name] is a 40 year old female with history of nephrolithiasis whose current stone episode dates back to August 2012, where she was admitted for an episode of urosepsis and bilateral large volume stones.  She underwent bilateral stenting as well as antibiotic therapy, a temporizing measure with plan for delay treatment of her stones.  She subsequently underwent first stage right percutaneous nephrostolithotomy on September 04, 2011, to address her right stones.  She recovered well from this.  Therefore, presented for surgery at this admission for addressing her left-sided stones.  At presentation, the patient exam is unremarkable.  HOSPITAL COURSE:  The patient underwent left nephrostomy tube placement in the morning of October 08, 2011, without complications.  She underwent operative left percutaneous nephrostolithotomy with Dr. Vernie Ammons later that day without acute complications.  She became clinically stone free and all tubes and drains were removed in this setting.  She had an uneventful evening and pain was controlled on pain medications.  By the morning of discharge, she was ambulating with ease, tolerating diet, pain was controlled with oral medications.  She  was voiding spontaneously until she was adequate for discharge.  DISCHARGE EXAM:  VITAL SIGNS:  Temperature 98.1, pulse 91, respirations 18, and blood pressure 134/84. GENERAL:  [name] is a pleasant female who appears her stated age. HEENT EXAM:  Normocephalic, atraumatic. CARDIAC EXAM:  Regular rate and rhythm. PULMONARY EXAM:  Clear to auscultation. ABDOMINAL EXAM:  Soft, nontender, and nondistended.  No CVA tenderness. GENITOURINARY:  In the left flank, there was clean, dry, and intact dressing.  Also, had a prior percutaneous nephrostolithotomy.  She had a well-healed site on her right side as well.  No catheters were in place. EXTREMITIES EXAM:  2+ pulses throughout. NEURO EXAM:  No focal deficits. PSYCHIATRIC:  The patient is euthymic.  Her affect is appropriate.  DISCHARGE MEDICATIONS: 1. Tylox. 2. Cipro.  FOLLOWUP:  The patient will return to [facility] on October 15, 2011, at 3:45.  PREOPERATIVE INSTRUCTIONS:  The patient voiced understanding.  He is to contact MD or return to the ER for temperature greater than 101.5, any refractory pain, nausea, vomiting, or acute change in medical status.          ______________________________ Sebastian Ache, MD     TM/MEDQ  D:  10/09/2011  T:  10/09/2011  Job:  [id number]  Electronically Signed by Barron Alvine M.D. on 10/14/2011 12:45:11 PM

## 2012-03-09 ENCOUNTER — Encounter (HOSPITAL_BASED_OUTPATIENT_CLINIC_OR_DEPARTMENT_OTHER): Payer: Self-pay | Admitting: *Deleted

## 2012-03-09 ENCOUNTER — Other Ambulatory Visit: Payer: Self-pay | Admitting: Urology

## 2012-03-09 NOTE — Progress Notes (Signed)
NPO AFTER MN. ARRIVES AT 1100. NEEDS HG. MAY TAKE PERCOCET IF NEEDED AM OF SURG. W/ SIP OF WATER.

## 2012-03-09 NOTE — H&P (Signed)
History of Present Illness          Urologic Hx:  Nephrolithiasis: She was admitted to the hospital in 8/12 for bilateral ureteral obstruction and sepsis. The patient had a CT scan showing a right renal pelvic stone measuring 2.35 x 1.96 x 2.47 cm. The left kidney had a pelvic stone measuring 1.5 to by 1.38 x 2.08 cm. The stones are dense with Hounsfield units ranging from 1,200-1,400. The patient had bilateral ureteral stents placed.   Right percutaneous nephrolithotomy on 09/03/11 cleared all of the stone from her right kidney. Left percutaneous nephrolithotomy on 10/08/11 cleared all the stone from her left kidney Stone analysis: 70% calcium phosphate, 20% magnesium ammonium phosphate and 10% tricalcium phosphate. Serum studies: Her electrolytes were found to be normal, her serum calcium was normal at 8.6 however her PTH was elevated at 91.4.  24 hour urine: She was found to have supersaturation of calcium oxalate, brushite and sodium urate with mild hypocitraturia and significantly low urine volume of 0.79 L despite an adequate collection based on urine creatinine.  Interval history:  After her left percutaneous nephrolithotomy she had been getting along quite well until this week.  She began having diffuse lower abdominal pain 03/06/12 but did not think much of it.  Yesterday 03/08/12, she developed increased lower abdominal pain into the LLQ abdomen and this pain became severe last night with associated nausea. Currently, she reports pressure like discomfort in the LLQ which is similar to sxs with prior stone episodes.  She does have chronic constipation for which she takes Amitiza daily.  She denies any issues with constipation or recent change in bowel habits.  She denies dysuria, increased frequency, urgency or documented fever.  She did feel a little warm/feverish 03/09/12 but did not check her temp.  She admits to seeing some blood in her urine approximately 1 month ago but was not sure if it was  definately from the bladder or possibly vaginal.  She had  Mirena IUD placed 09/2011 so she does not have regular menses.  She has been following a strict stone prevention diet and increased daily fluid intake as advised by Dr. Vernie Ammons.  She was felt to be stonefree at last visit 11/2011.   Past Medical History Problems  1. History of  Esophageal Reflux 530.81 2. History of  Nephrolithiasis V13.01 3. History of  Nephrolithiasis Bilateral V13.01  Surgical History Problems  1. History of  Breast Surgery Enlargement Procedure Bilateral 2. History of  Cesarean Section 3. History of  Cystoscopy With Insertion Of Ureteral Stent 4. History of  Cystoscopy With Ureteroscopy Right 5. History of  Lithotripsy 6. History of  Percutaneous Lithotomy For Stone Over 2cm. 7. History of  Percutaneous Lithotomy For Stone Over 2cm. 8. History of  Renal Endoscopy Via Nephrostomy With Foreign Body Removal  Current Meds 1. Amitiza 24 MCG Oral Capsule; Therapy: 19Nov2012 to  Allergies Medication  1. Codeine Derivatives  Family History Problems  1. Paternal history of  Family Health Status - Father's Age 68. Maternal history of  Family Health Status - Mother's Age 66. Family history of  Family Health Status Number Of Children 1 son & 2 daughters  Social History Problems  1. Alcohol Use <2 months 2. Marital History - Divorced V61.03 3. Never A Smoker 4. Occupation: Nurse, adult at [employer] Denied  5. History of  Caffeine Use  Review of Systems Genitourinary, constitutional, skin, eye, otolaryngeal, hematologic/lymphatic, cardiovascular, pulmonary, endocrine, musculoskeletal, gastrointestinal, neurological and psychiatric system(s)  were reviewed and pertinent findings if present are noted.  Genitourinary: hematuria.  Gastrointestinal: nausea and abdominal pain.    Vitals Vital Signs [Data Includes: Last 1 Day]  21Mar2013 09:48AM  BMI Calculated: 23.9 BSA Calculated: 1.68 Height: 5 ft 4  in Weight: 140 lb  Blood Pressure: 135 / 89, RUE, Sitting Temperature: 98.2 F, Oral Heart Rate: 104  Physical Exam Constitutional: Well nourished and well developed . No acute distress.  ENT:. The ears and nose are normal in appearance.  Neck: The appearance of the neck is normal and no neck mass is present.  Pulmonary: No respiratory distress and normal respiratory rhythm and effort.  Cardiovascular:. No peripheral edema.  Abdomen: No CVA tenderness.  Skin: Normal skin turgor, no visible rash and no visible skin lesions.  Neuro/Psych:. Mood and affect are appropriate.    Results/Data Urine [Data Includes: Last 1 Day]   21Mar2013  COLOR YELLOW   APPEARANCE CLEAR   SPECIFIC GRAVITY 1.020   pH 6.5   GLUCOSE NEG mg/dL  BILIRUBIN NEG   KETONE NEG mg/dL  BLOOD MOD   PROTEIN TRACE mg/dL  UROBILINOGEN 0.2 mg/dL  NITRITE NEG   LEUKOCYTE ESTERASE NEG   SQUAMOUS EPITHELIAL/HPF FEW   WBC 0-3 WBC/hpf  RBC 11-20 RBC/hpf  BACTERIA NONE SEEN   CRYSTALS NONE SEEN   CASTS NONE SEEN    The following images/tracing/specimen were independently visualized: .  CTU reveals hydroureteronephrosis on the left due to a 7 x75mm left distal ureteral stone.  There are also bilateral nonobstructing renal stones.  The following clinical lab reports were reviewed: Marland Kitchen  Urinalysis Selected Results  UA With REFLEX 21Mar2013 09:33AM Bruning, Ashlyn   Test Name Result Flag Reference  COLOR YELLOW  YELLOW  APPEARANCE CLEAR  CLEAR  SPECIFIC GRAVITY 1.020  1.005-1.030  pH 6.5  5.0-8.0  GLUCOSE NEG mg/dL  NEG  BILIRUBIN NEG  NEG  KETONE NEG mg/dL  NEG  BLOOD MOD A NEG  PROTEIN TRACE mg/dL  NEG  UROBILINOGEN 0.2 mg/dL  1.6-1.0  NITRITE NEG  NEG  LEUKOCYTE ESTERASE NEG  NEG  SQUAMOUS EPITHELIAL/HPF FEW  RARE  WBC 0-3 WBC/hpf  <4  RBC 11-20 RBC/hpf A <4  BACTERIA NONE SEEN  RARE  CRYSTALS NONE SEEN  NONE SEEN  CASTS NONE SEEN  NONE SEEN   AU CT-STONE PROTOCOL 21Mar2013 12:00AM Bruning, Ashlyn    Test Name Result Flag Reference  ** RADIOLOGY REPORT BY [facility] ** ORIGINAL APPROVED BY: Consuello Bossier, M.D. ON: 03/09/2012 12:06:09   *RADIOLOGY REPORT*  Clinical Data: Left lower quadrant pain and microscopic hematuria. History of renal stones. Lithotripsy. Prior ureteroscopy. Nausea vomiting.  CT ABDOMEN AND PELVIS WITHOUT CONTRAST (CT UROGRAM)  Technique: Contiguous axial images of the abdomen and pelvis without oral or intravenous contrast were obtained.  Comparison: Plain films of 11/16/2005.  Findings: Exam is limited for evaluation of entities other than urinary tract calculi due to lack of oral or intravenous contrast.   Clear lung bases. Bilateral breast implants. Mild cardiomegaly. Normal heart size without pericardial or pleural effusion. Liver is minimally excluded. Normal uninfused appearance of its imaged portions. Small splenule. Normal stomach, pancreas, gallbladder, biliary tract, adrenal glands.  Bilateral renal collecting systems stones. There is also increased density within the renal medulla bilaterally.  Interpolar left renal cyst measures approximately 12 mm.  No significant caliectasis. Mild left hydroureter. This continues to the level of a distal left ureteric stone. This measures 7 mm on transverse image  70 and 1.0 cm on coronal image 69.  Other calcifications within the left greater than right pelvis are phleboliths.  No retroperitoneal or retrocrural adenopathy.  Normal colon and terminal ileum. Appendix not visualized.  Normal small bowel without abdominal ascites.   No pelvic adenopathy. Normal urinary bladder. Intrauterine device. No adnexal mass or significant free pelvic fluid. Degenerative disc disease L4-L5.  IMPRESSION:  1. Mild left hydroureter secondary to a 7 x 10 mm distal left ureteric stone. 2. Bilateral renal collecting systems stones. 3. Increased density within the renal medulla. Suspicious  for medullary nephrocalcinosis.   Assessment Assessed  1. Diffuse Abdominal Pain 789.07 2. Microscopic Hematuria 599.72 3. Distal Ureteral Stone On The Left 592.1 4. Nephrolithiasis Of Both Kidneys 592.0  Plan  Health Maintenance (V70.0)  1. UA With REFLEX  Done: 21Mar2013 09:33AM    This case is discussed with Dr. Vernie Ammons who agrees with stated assessment and plan. Treatment options of left distal ureteral stone were discussed with the pt.  She has not had good success with ESWL in the past and therfore elects to proceed with cystoureteroscopy for stone extraction. Risks and benefits of each discussed and pt gives verbal consent to proceed as above. Orders completed. Rapaflo samples provided.  She will strain her urine until time of surgery. Percocet prn pain. Samples and Rx provided for Urocit K .  She will begin Urocit K BID.

## 2012-03-10 ENCOUNTER — Encounter (HOSPITAL_BASED_OUTPATIENT_CLINIC_OR_DEPARTMENT_OTHER): Payer: Self-pay | Admitting: Anesthesiology

## 2012-03-10 ENCOUNTER — Ambulatory Visit (HOSPITAL_BASED_OUTPATIENT_CLINIC_OR_DEPARTMENT_OTHER): Payer: PRIVATE HEALTH INSURANCE | Admitting: Anesthesiology

## 2012-03-10 ENCOUNTER — Encounter (HOSPITAL_BASED_OUTPATIENT_CLINIC_OR_DEPARTMENT_OTHER): Payer: Self-pay | Admitting: *Deleted

## 2012-03-10 ENCOUNTER — Ambulatory Visit (HOSPITAL_BASED_OUTPATIENT_CLINIC_OR_DEPARTMENT_OTHER)
Admission: RE | Admit: 2012-03-10 | Discharge: 2012-03-10 | Disposition: A | Discharge status: Home / Self Care | Payer: PRIVATE HEALTH INSURANCE | Source: Ambulatory Visit | Attending: Urology | Admitting: Urology

## 2012-03-10 ENCOUNTER — Encounter (HOSPITAL_BASED_OUTPATIENT_CLINIC_OR_DEPARTMENT_OTHER)
Admission: RE | Disposition: A | Discharge status: Home / Self Care | Payer: Self-pay | Source: Ambulatory Visit | Attending: Urology

## 2012-03-10 DIAGNOSIS — K219 Gastro-esophageal reflux disease without esophagitis: Secondary | ICD-10-CM | POA: Insufficient documentation

## 2012-03-10 DIAGNOSIS — Z79899 Other long term (current) drug therapy: Secondary | ICD-10-CM | POA: Insufficient documentation

## 2012-03-10 DIAGNOSIS — N201 Calculus of ureter: Secondary | ICD-10-CM | POA: Insufficient documentation

## 2012-03-10 HISTORY — DX: Personal history of urinary calculi: Z87.442

## 2012-03-10 HISTORY — DX: Calculus of ureter: N20.1

## 2012-03-10 HISTORY — DX: Calculus of kidney: N20.0

## 2012-03-10 HISTORY — DX: Other constipation: K59.09

## 2012-03-10 SURGERY — CYSTOURETEROSCOPY, WITH RETROGRADE PYELOGRAM AND STENT INSERTION
Anesthesia: General | Site: Ureter | Laterality: Left | Wound class: Clean Contaminated

## 2012-03-10 MED ORDER — OXYBUTYNIN CHLORIDE 5 MG PO TABS: 5.0000 mg | ORAL_TABLET | Freq: Once | ORAL | Status: DC

## 2012-03-10 MED ORDER — DROPERIDOL 2.5 MG/ML IJ SOLN
INTRAMUSCULAR | Status: DC | PRN
  Administered 2012-03-10: 0.625 mg via INTRAVENOUS

## 2012-03-10 MED ORDER — IOHEXOL 350 MG/ML SOLN
INTRAVENOUS | Status: DC | PRN
  Administered 2012-03-10: 50 mL

## 2012-03-10 MED ORDER — FENTANYL CITRATE 0.05 MG/ML IJ SOLN
25.0000 ug | INTRAMUSCULAR | Status: DC | PRN
  Administered 2012-03-10: 25 ug via INTRAVENOUS

## 2012-03-10 MED ORDER — SODIUM CHLORIDE 0.9 % IR SOLN
Status: DC | PRN
  Administered 2012-03-10: 3000 mL

## 2012-03-10 MED ORDER — MIDAZOLAM HCL 5 MG/5ML IJ SOLN
INTRAMUSCULAR | Status: DC | PRN
  Administered 2012-03-10: 2 mg via INTRAVENOUS

## 2012-03-10 MED ORDER — PHENAZOPYRIDINE HCL 200 MG PO TABS: 200.0000 mg | ORAL_TABLET | Freq: Three times a day (TID) | ORAL | Status: AC | PRN

## 2012-03-10 MED ORDER — LIDOCAINE HCL (CARDIAC) 20 MG/ML IV SOLN
INTRAVENOUS | Status: DC | PRN
  Administered 2012-03-10: 50 mg via INTRAVENOUS

## 2012-03-10 MED ORDER — LACTATED RINGERS IV SOLN
INTRAVENOUS | Status: DC
  Administered 2012-03-10 (×2): via INTRAVENOUS

## 2012-03-10 MED ORDER — CIPROFLOXACIN IN D5W 400 MG/200ML IV SOLN
INTRAVENOUS | Status: DC | PRN
  Administered 2012-03-10: 200 mg via INTRAVENOUS

## 2012-03-10 MED ORDER — METOCLOPRAMIDE HCL 5 MG/ML IJ SOLN
INTRAMUSCULAR | Status: DC | PRN
  Administered 2012-03-10: 10 mg via INTRAVENOUS

## 2012-03-10 MED ORDER — BELLADONNA ALKALOIDS-OPIUM 16.2-60 MG RE SUPP
RECTAL | Status: DC | PRN
  Administered 2012-03-10: 1 via RECTAL

## 2012-03-10 MED ORDER — DEXAMETHASONE SODIUM PHOSPHATE 4 MG/ML IJ SOLN
INTRAMUSCULAR | Status: DC | PRN
  Administered 2012-03-10: 10 mg via INTRAVENOUS

## 2012-03-10 MED ORDER — PHENAZOPYRIDINE HCL 200 MG PO TABS: 200.0000 mg | ORAL_TABLET | Freq: Once | ORAL | Status: DC

## 2012-03-10 MED ORDER — FENTANYL CITRATE 0.05 MG/ML IJ SOLN
INTRAMUSCULAR | Status: DC | PRN
  Administered 2012-03-10 (×2): 50 ug via INTRAVENOUS

## 2012-03-10 MED ORDER — PROPOFOL 10 MG/ML IV EMUL
INTRAVENOUS | Status: DC | PRN
  Administered 2012-03-10: 200 mg via INTRAVENOUS
  Administered 2012-03-10: 100 mg via INTRAVENOUS

## 2012-03-10 MED ORDER — ONDANSETRON HCL 4 MG/2ML IJ SOLN
INTRAMUSCULAR | Status: DC | PRN
  Administered 2012-03-10: 4 mg via INTRAVENOUS

## 2012-03-10 SURGICAL SUPPLY — 36 items
ADAPTER CATH URET PLST 4-6FR (CATHETERS) ×2 IMPLANT
BAG DRAIN URO-CYSTO SKYTR STRL (DRAIN) ×2 IMPLANT
BASKET LASER NITINOL 1.9FR (BASKET) IMPLANT
BASKET STNLS GEMINI 4WIRE 3FR (BASKET) IMPLANT
BASKET ZERO TIP NITINOL 2.4FR (BASKET) ×2 IMPLANT
BRUSH URET BIOPSY 3F (UROLOGICAL SUPPLIES) IMPLANT
CANISTER SUCT LVC 12 LTR MEDI- (MISCELLANEOUS) ×2 IMPLANT
CATH CLEAR GEL 3F BACKSTOP (CATHETERS) ×2 IMPLANT
CATH INTERMIT  6FR 70CM (CATHETERS) ×2 IMPLANT
CATH URET 5FR 28IN CONE TIP (BALLOONS)
CATH URET 5FR 70CM CONE TIP (BALLOONS) IMPLANT
CLOTH BEACON ORANGE TIMEOUT ST (SAFETY) ×2 IMPLANT
DRAPE CAMERA CLOSED 9X96 (DRAPES) ×2 IMPLANT
ELECT REM PT RETURN 9FT ADLT (ELECTROSURGICAL)
ELECTRODE REM PT RTRN 9FT ADLT (ELECTROSURGICAL) IMPLANT
GLOVE BIO SURGEON STRL SZ 6.5 (GLOVE) ×2 IMPLANT
GLOVE BIO SURGEON STRL SZ8 (GLOVE) ×2 IMPLANT
GLOVE ECLIPSE 6.0 STRL STRAW (GLOVE) ×2 IMPLANT
GLOVE INDICATOR 6.5 STRL GRN (GLOVE) ×2 IMPLANT
GOWN PREVENTION PLUS LG XLONG (DISPOSABLE) ×4 IMPLANT
GOWN STRL REIN XL XLG (GOWN DISPOSABLE) ×2 IMPLANT
GUIDEWIRE 0.038 PTFE COATED (WIRE) IMPLANT
GUIDEWIRE ANG ZIPWIRE 038X150 (WIRE) IMPLANT
GUIDEWIRE STR DUAL SENSOR (WIRE) ×2 IMPLANT
IV NS IRRIG 3000ML ARTHROMATIC (IV SOLUTION) ×4 IMPLANT
KIT BALLIN UROMAX 15FX10 (LABEL) IMPLANT
KIT BALLN UROMAX 15FX4 (MISCELLANEOUS) IMPLANT
KIT BALLN UROMAX 26 75X4 (MISCELLANEOUS)
LASER FIBER DISP (UROLOGICAL SUPPLIES) ×2 IMPLANT
PACK CYSTOSCOPY (CUSTOM PROCEDURE TRAY) ×2 IMPLANT
SET HIGH PRES BAL DIL (LABEL)
SHEATH ACCESS URETERAL 38CM (SHEATH) IMPLANT
SHEATH ACCESS URETERAL 54CM (SHEATH) IMPLANT
STENT PERCUFLEX 4.8FRX26 (STENTS) ×2 IMPLANT
STENT PERCUFLEX 4.8FRX28 (STENTS) IMPLANT
WATER STERILE IRR 3000ML UROMA (IV SOLUTION) IMPLANT

## 2012-03-10 NOTE — Transfer of Care (Signed)
Immediate Anesthesia Transfer of Care Note  Patient: [name]  Procedure(s) Performed: Procedure(s) (LRB): CYSTOSCOPY WITH RETROGRADE PYELOGRAM, URETEROSCOPY AND STENT PLACEMENT (Left)  Patient Location: PACU  Anesthesia Type: General  Level of Consciousness: drowsy, responds to name, follows commands  Airway & Oxygen Therapy: Patient Spontanous Breathing and Patient connected to face mask oxygen  Post-op Assessment: Report given to PACU RN and Post -op Vital signs reviewed and stable  Post vital signs: Reviewed and stable  Complications: No apparent anesthesia complications

## 2012-03-10 NOTE — Anesthesia Preprocedure Evaluation (Signed)
Anesthesia Evaluation  Patient identified by MRN, date of birth, ID band Patient awake    Reviewed: Allergy & Precautions, H&P , NPO status , Patient's Chart, lab work & pertinent test results, reviewed documented beta blocker date and time   Airway Mallampati: II TM Distance: >3 FB Neck ROM: Full    Dental  (+) Teeth Intact and Dental Advisory Given   Pulmonary neg pulmonary ROS,  breath sounds clear to auscultation        Cardiovascular negative cardio ROS  Rhythm:Regular Rate:Normal     Neuro/Psych negative neurological ROS  negative psych ROS   GI/Hepatic negative GI ROS, Neg liver ROS,   Endo/Other  negative endocrine ROS  Renal/GU Kidney stone  negative genitourinary   Musculoskeletal negative musculoskeletal ROS (+)   Abdominal   Peds negative pediatric ROS (+)  Hematology negative hematology ROS (+)   Anesthesia Other Findings   Reproductive/Obstetrics negative OB ROS                           Anesthesia Physical Anesthesia Plan  ASA: I  Anesthesia Plan: General   Post-op Pain Management:    Induction: Intravenous  Airway Management Planned: LMA  Additional Equipment:   Intra-op Plan:   Post-operative Plan: Extubation in OR  Informed Consent: I have reviewed the patients History and Physical, chart, labs and discussed the procedure including the risks, benefits and alternatives for the proposed anesthesia with the patient or authorized representative who has indicated his/her understanding and acceptance.   Dental advisory given  Plan Discussed with: CRNA and Tabbert  Anesthesia Plan Comments:         Anesthesia Quick Evaluation

## 2012-03-10 NOTE — Op Note (Signed)
PATIENT:  Job Founds Dancy  PRE-OPERATIVE DIAGNOSIS:  left Ureteral calculus  POST-OPERATIVE DIAGNOSIS: Same  PROCEDURE:  1. Left retrograde pyelogram with interpretation 2. Left ureteroscopy with laser lithotripsy and stone extraction 3. Left double-J stent placement  Vitrano: Garnett Farm, MD  INDICATION: Dhriti is a 41 year old female with a history of calculus disease. She developed severe left flank pain and was seen in the office yesterday. She was found to have a 7 x 10 mm stone in the left distal ureter with associated hydronephrosis. Her pain has eased off but she has not seen her stone past. She is brought to the operating room for definitive treatment.  ANESTHESIA:  General  EBL:  Minimal  DRAINS: 4.8 French, 24 cm Contour stent (with string)  SPECIMEN:  Stone fragments  DISPOSITION OF SPECIMEN:  Given the patient  DESCRIPTION OF PROCEDURE: The patient was taken to the major OR and placed on the table. General anesthesia was administered and then the patient was moved to the dorsal lithotomy position. The genitalia was sterilely prepped and draped. An official timeout was performed.  Initially the 22 French cystoscope with 12 lens was passed under direct vision. The bladder was then entered and fully inspected. It was noted be free of any tumors stones or inflammatory lesions. Ureteral orifices were of normal configuration and position. A 6 French open-ended ureteral catheter was then passed through the cystoscope into the ureteral orifice in order to perform a left retrograde pyelogram.  A retrograde pyelogram was performed by injecting full-strength contrast up the left ureter under direct fluoroscopic control. It revealed a filling defect in the distal lureter consistent with the stone seen on the preoperative KUB. The remainder of the ureter was noted to be normal as was the intrarenal collecting system. I then passed a 0.038 inch floppy-tipped guidewire through the open  ended catheter and into the area of the renal pelvis and this was left in place. I then proceeded with ureteroscopy.  A 6 French rigid ureteroscope was then passed under direct into the bladder and into the left orifice and up the ureter. I noted the ureteral orifice and distal ureter were patulous enough to allow easy passage of the 6 [place] scope without need for dilatation. The stone was identified and I felt it was too large to extract and therefore elected to proceed with laser lithotripsy. I injected backstop material proximal to the stone in order to prevent proximal migration of the stone fragments. The 200  holmium laser fiber was used to fragment the stone. I then used the nitinol basket to extract all of the stone fragments and reinspection of the ureter ureteroscopically revealed no further stone fragments and no injury to the ureter. I therefore backloaded the cystoscope over the guidewire and passed the stent over the guidewire into the area of the renal pelvis. As the guidewire was removed good curl was noted in the renal pelvis. The bladder was drained and the cystoscope was then removed. The patient tolerated the procedure well no intraoperative complications.  PLAN OF CARE: Discharge to home after PACU  PATIENT DISPOSITION:  PACU - hemodynamically stable.

## 2012-03-10 NOTE — Interval H&P Note (Signed)
History and Physical Interval Note:  03/10/2012 12:16 PM  [name]  has presented today for surgery, with the diagnosis of LEFT URETERAL STONE  The various methods of treatment have been discussed with the patient and family. After consideration of risks, benefits and other options for treatment, the patient has consented to  Procedure(s) (LRB): CYSTOSCOPY WITH RETROGRADE PYELOGRAM, URETEROSCOPY AND STENT PLACEMENT (Left) as a surgical intervention .  The patients' history has been reviewed, patient examined, no change in status, stable for surgery.  I have reviewed the patients' chart and labs.  Questions were answered to the patient's satisfaction.     Garnett Farm

## 2012-03-10 NOTE — Discharge Instructions (Signed)

## 2012-03-13 LAB — POCT HEMOGLOBIN-HEMACUE: Hemoglobin: 14.3 g/dL (ref 12.0–15.0)

## 2012-03-13 NOTE — Anesthesia Postprocedure Evaluation (Signed)
  Anesthesia Post-op Note  Patient: [name]  Procedure(s) Performed: Procedure(s) (LRB): CYSTOSCOPY WITH RETROGRADE PYELOGRAM, URETEROSCOPY AND STENT PLACEMENT (Left)  Patient Location: PACU  Anesthesia Type: General  Level of Consciousness: awake and alert   Airway and Oxygen Therapy: Patient Spontanous Breathing  Post-op Pain: mild  Post-op Assessment: Post-op Vital signs reviewed, Patient's Cardiovascular Status Stable, Respiratory Function Stable, Patent Airway and No signs of Nausea or vomiting  Post-op Vital Signs: stable  Complications: No apparent anesthesia complications

## 2012-04-11 ENCOUNTER — Encounter (INDEPENDENT_AMBULATORY_CARE_PROVIDER_SITE_OTHER): Payer: Self-pay | Admitting: Surgery

## 2012-04-12 ENCOUNTER — Ambulatory Visit (INDEPENDENT_AMBULATORY_CARE_PROVIDER_SITE_OTHER): Payer: PRIVATE HEALTH INSURANCE | Admitting: Surgery

## 2012-04-17 ENCOUNTER — Other Ambulatory Visit: Payer: Self-pay | Admitting: Obstetrics and Gynecology

## 2012-04-18 NOTE — Telephone Encounter (Signed)
This ep pt

## 2012-04-18 NOTE — Telephone Encounter (Signed)
Pt want rx for  metronidazole

## 2012-04-24 ENCOUNTER — Encounter (INDEPENDENT_AMBULATORY_CARE_PROVIDER_SITE_OTHER): Payer: Self-pay | Admitting: Surgery

## 2012-04-24 ENCOUNTER — Ambulatory Visit (INDEPENDENT_AMBULATORY_CARE_PROVIDER_SITE_OTHER): Payer: PRIVATE HEALTH INSURANCE | Admitting: Surgery

## 2012-04-24 ENCOUNTER — Other Ambulatory Visit (INDEPENDENT_AMBULATORY_CARE_PROVIDER_SITE_OTHER): Payer: Self-pay | Admitting: Surgery

## 2012-04-24 VITALS — BP 122/92 | HR 74 | Temp 98.6°F | Resp 14 | Ht 64.0 in | Wt 145.2 lb

## 2012-04-24 DIAGNOSIS — N200 Calculus of kidney: Secondary | ICD-10-CM

## 2012-04-24 DIAGNOSIS — E213 Hyperparathyroidism, unspecified: Secondary | ICD-10-CM

## 2012-04-24 NOTE — Progress Notes (Signed)
Chief Complaint  Patient presents with  . New Evaluation    rule out primary hyperparathyroidism - referral from Dr. Ihor Gully and Marcello Fennel, PA, [facility]    HISTORY: Patient is a 41 year old white female referred by her urologist for evaluation for possible primary hyperparathyroidism. Patient has a 5 year history of nephrolithiasis. This has required hospitalization and operative intervention. She has had at least 8 kidney stones. Testing revealed a high content of calcium oxalate. Urinary calcium levels were normal at 165 mg/dL. Patient had laboratory studies did show a low normal calcium level of 8.6. However her intact PTH level was elevated at 91.  Patient has had no prior head or neck surgery. She has had no further imaging studies of the neck. There is a family history of hypothyroidism and a maternal grandmother. There is no family history of parathyroid disease.  Patient has noted moderate constipation. This has been a chronic problem. Patient also notes significant fatigue.  Past Medical History  Diagnosis Date  . Left ureteral calculus   . Renal stones   . History of kidney stones   . Chronic constipation   . Esophageal reflux   . Personal history of urinary calculi   . Abdominal pain, generalized   . Microscopic hematuria      Current Outpatient Prescriptions  Medication Sig Dispense Refill  . polyethylene glycol powder (MIRALAX) powder Take 17 g by mouth daily.      . vitamin C (ASCORBIC ACID) 500 MG tablet Take 500 mg by mouth daily.         Allergies  Allergen Reactions  . Codeine Shortness Of Breath    RESPIRATORY DISTRESS     Family History  Problem Relation Age of Onset  . Cancer Maternal Grandfather     lung     History   Social History  . Marital Status: Divorced    Spouse Name: N/A    Number of Children: N/A  . Years of Education: N/A   Social History Main Topics  . Smoking status: Never Smoker   . Smokeless tobacco: None    . Alcohol Use: Yes     social  . Drug Use: No  . Sexually Active:    Other Topics Concern  . None   Social History Narrative  . None     REVIEW OF SYSTEMS - PERTINENT POSITIVES ONLY: Nephrolithiasis, constipation, fatigue  EXAM: Filed Vitals:   04/24/12 1512  BP: 122/92  Pulse: 74  Temp: 98.6 F (37 C)  Resp: 14    HEENT: normocephalic; pupils equal and reactive; sclerae clear; dentition good; mucous membranes moist NECK:  No palpable nodules or masses; symmetric on extension; no palpable anterior or posterior cervical lymphadenopathy; no supraclavicular masses; no tenderness CHEST: clear to auscultation bilaterally without rales, rhonchi, or wheezes CARDIAC: regular rate and rhythm without significant murmur; peripheral pulses are full EXT:  non-tender without edema; no deformity NEURO: no gross focal deficits; no sign of tremor   LABORATORY RESULTS: See [facility] ([facility]pic) for most recent results   RADIOLOGY RESULTS: See [facility] ([facility]L-Epic) for most recent results   IMPRESSION: #1 history of recurrent nephrolithiasis #2 elevated intact parathyroid hormone level, rule out primary hyperparathyroidism  PLAN: The patient and I discussed all the above findings an issues. Certainly she could have underlying primary hyperparathyroidism as a cause of recurrent nephrolithiasis, chronic constipation, and chronic fatigue. However, her serum calcium level is in the low normal range. Vitamin D level is unknown.  Patient will have a vitamin D level drawn this week at the laboratory.  Patient will be scheduled for a nuclear medicine parathyroid scan in the near future.  I will contact the patient with the results of these studies and we will move forward with additional testing or surgical intervention as indicated. Patient understands and agrees to proceed with further evaluation.  Velora Heckler, MD, FACS General & Endocrine Surgery [facility]   Visit Diagnoses: 1. Nephrolithiasis   2. Hyperparathyroidism     Primary Care Physician: Dr. Dierdre Forth  Urology: Dr. Ihor Gully

## 2012-04-24 NOTE — Patient Instructions (Signed)
Parathyroidectomy A parathyroidectomy is surgery to remove one or more parathyroid glands. These glands produce a hormone (parathyroid hormone) that helps control the level of calcium in your body. The glands are very small, about the size of a pea. They are located in your neck, close to your thyroid gland and your Adam's apple. Most people (85%) have four parathyroid glands,some people may have one or two more than that. Hyperparathyroidism is when too much parathyroid hormone is being produced. Usually this is caused by one of the parathyroid glands becoming enlarged, but it can also be caused by more than one of the glands. Hyperparathyroidism is found during blood tests that show high calcium in the blood. Parathyroid hormone levels will also be elevated. Cancer also can cause hyperparathyroidism, but this is rare. For the most common type of hyperparathyroidism, the treatment is surgical removal of the parathyroid gland that is enlarged. For patients with kidney failure and hyperparathyroidism, other treatment will be tried before surgery is done on the parathyroid.  Many times x-ray studies are done to find out which parathyroid gland or glands is malfunctioning. The decision about the best treatment for hyperparathyroidism is between the patient, their primary doctor, an endocrinologist, and a Saefong experienced in parathyroid surgery. LET YOUR CAREGIVER KNOW ABOUT:  Any allergies.   All medications you are taking, including:   Herbs, eyedrops, over-the-counter medications and creams.   Blood thinners (anticoagulants), aspirin or other drugs that could affect blood clotting.   Use of steroids (by mouth or as creams).   Previous problems with anesthetics, including local anesthetics.   Possibility of pregnancy, if this applies.   Any history of blood clots.   Any history of bleeding or other blood problems.   Previous surgery.   Smoking history.   Other health problems.  RISKS  AND COMPLICATIONS   Short-term possibilities include:   Excessive bleeding.   Pain.   Infection near the incision.   Slow healing.   Pooling of blood under the wound (hematoma).   Damage to nerves in your neck.   Blood clots.   Difficulty breathing. This is very rare. It also is almost always temporary.   Longer-term possibilities include:   Scarring.   Skin damage.   Damage to blood vessels in the area.   Need for additional surgery.   A hoarse or weak voice. This is usually temporary. It can be the result of nerve damage.   Development of hypoparathyroidism. This means you are not making enough parathyroid hormone. It is rare. If it occurs, you will need to take calcium supplements daily.  BEFORE THE PROCEDURE  Sometimes the surgery is done on an outpatient basis. This means you could go home the same day as your surgery. Other times, people need to stay in the hospital overnight. Ask your Ranney what you should expect.   If your surgery will be an outpatient procedure, arrange for someone to drive you home after the surgery.   Two weeks before your surgery, stop using aspirin and non-steroidal anti-inflammatory drugs (NSAID's) for pain relief. This includes prescription drugs and over-the-counter drugs such as ibuprofen and naproxen. Also stop taking vitamin E.   If you take blood-thinners, ask your healthcare provider when you should stop taking them.   Do not eat or drink for about 8 hours before your surgery.   You might be asked to shower or wash with a special antibacterial soap before the procedure.   Arrive at least an hour before the   surgery, or whenever your Englert recommends. This will give you time to check in and fill out any needed paperwork.  PROCEDURE  The preparation:   You will change into a hospital gown.   You will be given an IV. A needle will be inserted in your arm. Medication will be able to flow directly into your body through this  needle.   You might be given a sedative to help you relax.   You will be given a drug that puts you to sleep during the surgery (general anesthetic).   The procedure:   Once you are asleep, the Ksiazek will make a small cut (incision) in your lower neck. Ask your Hulgan where the incision will be.   The Principato will look for the gland(s) that are not working well. Often a tissue sample from a gland is used to determine this.   Any glands that are not working well will be removed.   The Harts will close the incision with stitches, often these are hidden under the skin.  AFTER THE PROCEDURE  You will stay in a recovery area until the anesthesia has worn off. Your blood pressure and heart rate will be checked.   If your surgery was an outpatient procedure, you will go home the same day.   If you need to stay in the hospital, you will be moved to a hospital room. You will probably stay for two to three days. This will depend on how quickly you recover.   While you are in the hospital, your blood will be tested to check the calcium levels in your body.  HOME CARE INSTRUCTIONS   Take any medication that your Effertz prescribes. Follow the directions carefully. Take all of the medication.   Ask your Brahmbhatt whether you can take over-the-counter medicines for pain, discomfort or fever. Do not take aspirin unless your healthcare provider says to. Aspirin increases the chances of bleeding.   Do not get the wound wet for the first few days after surgery (or until the Hochstatter tells you it is OK).  SEEK MEDICAL CARE IF:   You notice blood or fluid leaking from the wound, or it becomes red or swollen.   You have trouble breathing.   You have trouble speaking.   You become nauseous or throw up for more than two days after the surgery.   You develop a fever of more than 100.5 F (38.1 C).  SEEK IMMEDIATE MEDICAL CARE IF:   Breathing becomes more difficult.   You develop a fever of  102.0 F (38.9 C) or higher.  Document Released: 03/04/2009 Document Revised: 11/25/2011 Document Reviewed: 03/04/2009 Exit[employer] Patient Information 2012 [employer]. 

## 2012-04-26 ENCOUNTER — Telehealth: Payer: Self-pay | Admitting: Obstetrics and Gynecology

## 2012-04-26 NOTE — Telephone Encounter (Signed)
Pt wants to know if she can get a refill on prescription for metro-gel.  Pt has IUD and is having recurring symptoms, such as odor.  Informed pt that I would f/u with doctor and cb.  Acknowledged and understood.

## 2012-04-26 NOTE — Telephone Encounter (Signed)
Left vm with Kerri Nilan in regards to a prescription request, informed her to give a cb at office.

## 2012-05-01 ENCOUNTER — Other Ambulatory Visit: Payer: Self-pay | Admitting: Obstetrics and Gynecology

## 2012-05-01 ENCOUNTER — Encounter (HOSPITAL_COMMUNITY): Payer: PRIVATE HEALTH INSURANCE

## 2012-05-02 ENCOUNTER — Other Ambulatory Visit: Payer: Self-pay

## 2012-05-02 NOTE — Telephone Encounter (Signed)
Patient may have refill on her Metronidazole.

## 2012-05-04 ENCOUNTER — Ambulatory Visit (HOSPITAL_COMMUNITY)
Admission: RE | Admit: 2012-05-04 | Discharge: 2012-05-04 | Disposition: A | Discharge status: Home / Self Care | Payer: PRIVATE HEALTH INSURANCE | Source: Ambulatory Visit | Attending: Surgery | Admitting: Surgery

## 2012-05-04 ENCOUNTER — Encounter (HOSPITAL_COMMUNITY)
Admission: RE | Admit: 2012-05-04 | Discharge: 2012-05-04 | Disposition: A | Discharge status: Home / Self Care | Payer: PRIVATE HEALTH INSURANCE | Source: Ambulatory Visit | Attending: Surgery | Admitting: Surgery

## 2012-05-04 DIAGNOSIS — E213 Hyperparathyroidism, unspecified: Secondary | ICD-10-CM | POA: Insufficient documentation

## 2012-05-04 DIAGNOSIS — N200 Calculus of kidney: Secondary | ICD-10-CM

## 2012-05-04 MED ORDER — TECHNETIUM TC 99M SESTAMIBI - CARDIOLITE
25.0000 | Freq: Once | INTRAVENOUS | Status: AC | PRN
  Administered 2012-05-04: 08:00:00 25 via INTRAVENOUS

## 2012-05-04 NOTE — Telephone Encounter (Signed)
Tc to pt to let her know she can pick up the med Meronidazole at her pharmacy.  Sent rx thru e scribe per EP. lm

## 2012-05-05 ENCOUNTER — Telehealth (INDEPENDENT_AMBULATORY_CARE_PROVIDER_SITE_OTHER): Payer: Self-pay

## 2012-05-05 ENCOUNTER — Other Ambulatory Visit (INDEPENDENT_AMBULATORY_CARE_PROVIDER_SITE_OTHER): Payer: Self-pay

## 2012-05-05 NOTE — Telephone Encounter (Signed)
LMOM for pt to call for date of MRI. It will be  05-11-12  Arrive 6:00pm at  [address] [facility] .

## 2012-05-05 NOTE — Telephone Encounter (Signed)
LMOM for pt to call. We need to set up mri. Order in epic. Dr Gerrit Friends wants mri neck to be scheduled since scan did not show adenoma.

## 2012-05-05 NOTE — Telephone Encounter (Signed)
Message copied by Joanette Gula on Fri May 05, 2012  9:22 AM ------      Message from: Velora Heckler      Created: Fri May 05, 2012  8:02 AM       Sestamibi scan fails to reveal evidence of parathyroid adenoma.  Will ask my nurse to schedule MRI with contrast of neck in hopes of identifying a small parathyroid adenoma.      Velora Heckler, MD, [facility]      Office: [phone]

## 2012-05-10 ENCOUNTER — Telehealth (INDEPENDENT_AMBULATORY_CARE_PROVIDER_SITE_OTHER): Payer: Self-pay

## 2012-05-10 ENCOUNTER — Other Ambulatory Visit (INDEPENDENT_AMBULATORY_CARE_PROVIDER_SITE_OTHER): Payer: Self-pay

## 2012-05-10 NOTE — Telephone Encounter (Signed)
Patient scheduled for MRI of neck 05/11/2012 would like a prescription for anxiety, patient states she's claustrophobic.  Patient's pharmacy is [facility] on [address], please call patient at work to discuss further.

## 2012-05-11 ENCOUNTER — Ambulatory Visit
Admission: RE | Admit: 2012-05-11 | Discharge: 2012-05-11 | Disposition: A | Discharge status: Home / Self Care | Payer: PRIVATE HEALTH INSURANCE | Source: Ambulatory Visit | Attending: Surgery | Admitting: Surgery

## 2012-05-11 ENCOUNTER — Telehealth (INDEPENDENT_AMBULATORY_CARE_PROVIDER_SITE_OTHER): Payer: Self-pay

## 2012-05-11 ENCOUNTER — Other Ambulatory Visit: Payer: PRIVATE HEALTH INSURANCE

## 2012-05-11 MED ORDER — GADOBENATE DIMEGLUMINE 529 MG/ML IV SOLN
12.0000 mL | Freq: Once | INTRAVENOUS | Status: AC | PRN
  Administered 2012-05-11: 12 mL via INTRAVENOUS

## 2012-05-11 NOTE — Telephone Encounter (Signed)
Valium 5mg  #2, one or two po by mouth prior to MRI called to [facility] [phone] per Dr Ardine Eng order.

## 2012-05-18 ENCOUNTER — Telehealth (INDEPENDENT_AMBULATORY_CARE_PROVIDER_SITE_OTHER): Payer: Self-pay | Admitting: Surgery

## 2012-05-18 DIAGNOSIS — N200 Calculus of kidney: Secondary | ICD-10-CM

## 2012-05-18 NOTE — Telephone Encounter (Signed)
To Dr. Vernie Ammons and Dr. Pennie Rushing:  Called results of Sestamibi scan (negative) and MRI scan (negative for parathyroid adenoma).  Vit D level normal at 54.  No evidence parathyroid adenoma.  Recommend:  1.  Referral to endocrinologist for further evaluation  2.  Repeat intact PTH level in 3-6 months.  I will be happy to see patient again if surgical issue identified.  Velora Heckler, MD, [facility] Office: [phone]

## 2012-05-26 ENCOUNTER — Telehealth (INDEPENDENT_AMBULATORY_CARE_PROVIDER_SITE_OTHER): Payer: Self-pay

## 2012-05-26 NOTE — Telephone Encounter (Signed)
Pt called stating she was to have referral to endocrine MD. I reviewed notes and I do see mention of this in epic. Pt advised I will verify which endocrine MD Dr Gerrit Friends suggests and will make the referral.

## 2012-05-30 ENCOUNTER — Telehealth (INDEPENDENT_AMBULATORY_CARE_PROVIDER_SITE_OTHER): Payer: Self-pay

## 2012-05-30 ENCOUNTER — Other Ambulatory Visit (INDEPENDENT_AMBULATORY_CARE_PROVIDER_SITE_OTHER): Payer: Self-pay

## 2012-05-30 DIAGNOSIS — E213 Hyperparathyroidism, unspecified: Secondary | ICD-10-CM

## 2012-05-30 NOTE — Telephone Encounter (Signed)
Referral request faxed to Dr Sharl Ma with records.

## 2013-03-25 ENCOUNTER — Emergency Department (HOSPITAL_COMMUNITY)
Admission: EM | Admit: 2013-03-25 | Discharge: 2013-03-25 | Disposition: A | Discharge status: Home / Self Care | Payer: PRIVATE HEALTH INSURANCE | Attending: Emergency Medicine | Admitting: Emergency Medicine

## 2013-03-25 ENCOUNTER — Emergency Department (HOSPITAL_COMMUNITY): Payer: PRIVATE HEALTH INSURANCE

## 2013-03-25 ENCOUNTER — Encounter (HOSPITAL_COMMUNITY): Payer: Self-pay | Admitting: Emergency Medicine

## 2013-03-25 DIAGNOSIS — Z79899 Other long term (current) drug therapy: Secondary | ICD-10-CM | POA: Insufficient documentation

## 2013-03-25 DIAGNOSIS — Z9889 Other specified postprocedural states: Secondary | ICD-10-CM | POA: Insufficient documentation

## 2013-03-25 DIAGNOSIS — Z8742 Personal history of other diseases of the female genital tract: Secondary | ICD-10-CM | POA: Insufficient documentation

## 2013-03-25 DIAGNOSIS — Z87442 Personal history of urinary calculi: Secondary | ICD-10-CM | POA: Insufficient documentation

## 2013-03-25 DIAGNOSIS — R110 Nausea: Secondary | ICD-10-CM | POA: Insufficient documentation

## 2013-03-25 DIAGNOSIS — Z8719 Personal history of other diseases of the digestive system: Secondary | ICD-10-CM | POA: Insufficient documentation

## 2013-03-25 DIAGNOSIS — R109 Unspecified abdominal pain: Secondary | ICD-10-CM | POA: Insufficient documentation

## 2013-03-25 DIAGNOSIS — Z3202 Encounter for pregnancy test, result negative: Secondary | ICD-10-CM | POA: Insufficient documentation

## 2013-03-25 DIAGNOSIS — R630 Anorexia: Secondary | ICD-10-CM | POA: Insufficient documentation

## 2013-03-25 DIAGNOSIS — M549 Dorsalgia, unspecified: Secondary | ICD-10-CM | POA: Insufficient documentation

## 2013-03-25 LAB — CBC WITH DIFFERENTIAL/PLATELET
Eosinophils Absolute: 0.1 10*3/uL (ref 0.0–0.7)
Hemoglobin: 14 g/dL (ref 12.0–15.0)
Lymphs Abs: 1.9 10*3/uL (ref 0.7–4.0)
MCH: 31.5 pg (ref 26.0–34.0)
Monocytes Relative: 9 % (ref 3–12)
Neutro Abs: 3 10*3/uL (ref 1.7–7.7)
Neutrophils Relative %: 55 % (ref 43–77)
RBC: 4.44 MIL/uL (ref 3.87–5.11)

## 2013-03-25 LAB — URINALYSIS, ROUTINE W REFLEX MICROSCOPIC
Bilirubin Urine: NEGATIVE
Ketones, ur: NEGATIVE mg/dL
Nitrite: NEGATIVE
Protein, ur: NEGATIVE mg/dL
Specific Gravity, Urine: 1.018 (ref 1.005–1.030)
Urobilinogen, UA: 1 mg/dL (ref 0.0–1.0)

## 2013-03-25 LAB — COMPREHENSIVE METABOLIC PANEL
Alkaline Phosphatase: 46 U/L (ref 39–117)
BUN: 22 mg/dL (ref 6–23)
Chloride: 101 mEq/L (ref 96–112)
GFR calc Af Amer: >90 mL/min (ref >90)
GFR calc non Af Amer: >90 mL/min (ref >90)
Glucose, Bld: 93 mg/dL (ref 70–99)
Potassium: 3.5 mEq/L (ref 3.5–5.1)
Total Bilirubin: 0.9 mg/dL (ref 0.3–1.2)

## 2013-03-25 LAB — URINALYSIS, MICROSCOPIC ONLY
Ketones, ur: NEGATIVE mg/dL
Nitrite: NEGATIVE
Protein, ur: 30 mg/dL — AB

## 2013-03-25 LAB — WET PREP, GENITAL
Clue Cells Wet Prep HPF POC: NONE SEEN
Trich, Wet Prep: NONE SEEN

## 2013-03-25 LAB — POCT PREGNANCY, URINE: Preg Test, Ur: NEGATIVE

## 2013-03-25 MED ORDER — SODIUM CHLORIDE 0.9 % IV SOLN
Freq: Once | INTRAVENOUS | Status: AC
  Administered 2013-03-25: 13:00:00 via INTRAVENOUS

## 2013-03-25 MED ORDER — KETOROLAC TROMETHAMINE 30 MG/ML IJ SOLN
30.0000 mg | Freq: Once | INTRAMUSCULAR | Status: AC
  Administered 2013-03-25: 30 mg via INTRAVENOUS
  Filled 2013-03-25: qty 1

## 2013-03-25 MED ORDER — IOHEXOL 300 MG/ML  SOLN
50.0000 mL | Freq: Once | INTRAMUSCULAR | Status: AC | PRN
  Administered 2013-03-25: 50 mL via INTRAVENOUS

## 2013-03-25 MED ORDER — ONDANSETRON HCL 4 MG/2ML IJ SOLN
4.0000 mg | Freq: Once | INTRAMUSCULAR | Status: AC
  Administered 2013-03-25: 4 mg via INTRAVENOUS
  Filled 2013-03-25: qty 2

## 2013-03-25 MED ORDER — ONDANSETRON HCL 4 MG PO TABS: 4.0000 mg | ORAL_TABLET | Freq: Four times a day (QID) | ORAL | Status: DC

## 2013-03-25 MED ORDER — IOHEXOL 300 MG/ML  SOLN
100.0000 mL | Freq: Once | INTRAMUSCULAR | Status: AC | PRN
  Administered 2013-03-25: 100 mL via INTRAVENOUS

## 2013-03-25 MED ORDER — MORPHINE SULFATE 4 MG/ML IJ SOLN
4.0000 mg | Freq: Once | INTRAMUSCULAR | Status: AC
  Administered 2013-03-25: 4 mg via INTRAVENOUS
  Filled 2013-03-25: qty 1

## 2013-03-25 NOTE — ED Provider Notes (Signed)
History     CSN: [id number]  Arrival date & time 03/25/13  1211   First MD Initiated Contact with Patient 03/25/13 1233      Chief Complaint  Patient presents with  . Flank Pain  . Abdominal Pain    (Consider location/radiation/quality/duration/timing/severity/associated sxs/prior treatment) HPI Comments: Patient has a complex history of kidney stones, presenting with generalized abdominal pain for the past 5 days worse in the right side of her abdomen. This pain is intermittently sharp and stabbing in her right lower quadrant. Associated with nausea but no decreased appetite. Denies any fever, chills, urinary or vaginal symptoms. Denies any back pain. Saw her urologist 2 months ago and was told she had some stones in her kidneys left. Taking Excedrin for pain without relief. Still has appendix and gallbladder.  The history is provided by the patient.    Past Medical History  Diagnosis Date  . Left ureteral calculus   . Renal stones   . History of kidney stones   . Chronic constipation   . Esophageal reflux   . Personal history of urinary calculi   . Abdominal pain, generalized   . Microscopic hematuria     Past Surgical History  Procedure Laterality Date  . Percutanous nephrostolithotomy  10-08-2011    LEFT  . Percutaneous nephrostolithotomy  09-03-2011    RIGHT  . Right ureteroscopic stone extraction  11-26-2005  . Cysto/ bilateral ureteral stent placement  08-05-2011  . Repeat cesarean section  04-13-2002    W/ TUBAL LIGATION  . Breast enhancement surgery    . Lithothripsy    . Renal endoscopy via nephrostomy / pyelostomy      Family History  Problem Relation Age of Onset  . Cancer Maternal Grandfather     lung    History  Substance Use Topics  . Smoking status: Never Smoker   . Smokeless tobacco: Not on file  . Alcohol Use: Yes     Comment: social    OB History   Grav Para Term Preterm Abortions TAB SAB Ect Mult Living                  Review of  Systems  Constitutional: Positive for activity change and appetite change. Negative for fever.  HENT: Negative for congestion and rhinorrhea.   Respiratory: Negative for cough, chest tightness and shortness of breath.   Cardiovascular: Negative for chest pain.  Gastrointestinal: Positive for nausea and abdominal pain. Negative for vomiting, diarrhea and constipation.  Genitourinary: Positive for flank pain. Negative for vaginal bleeding and vaginal discharge.  Musculoskeletal: Positive for back pain.  Skin: Negative for rash.  Neurological: Negative for dizziness, weakness and headaches.  A complete 10 system review of systems was obtained and all systems are negative except as noted in the HPI and PMH.    Allergies  Codeine  Home Medications   Current Outpatient Rx  Name  Route  Sig  Dispense  Refill  . chlorthalidone (HYGROTON) 25 MG tablet   Oral   Take 25 mg by mouth daily.         . meloxicam (MOBIC) 7.5 MG tablet   Oral   Take 7.5 mg by mouth daily.         . polyethylene glycol powder (MIRALAX) powder   Oral   Take 17 g by mouth daily.           BP 111/71  Pulse 70  Temp(Src) 97.8 F (36.6 C) (Oral)  Resp 18  SpO2 99%  Physical Exam  Constitutional: She appears well-developed and well-nourished.  HENT:  Head: Normocephalic and atraumatic.  Mouth/Throat: Oropharynx is clear and moist.  Eyes: Conjunctivae and EOM are normal. Pupils are equal, round, and reactive to light.  Neck: Normal range of motion. Neck supple.  Cardiovascular: Normal rate, regular rhythm and normal heart sounds.   No murmur heard. Pulmonary/Chest: Effort normal and breath sounds normal. No respiratory distress.  Abdominal: Soft. There is tenderness. There is no rebound and no guarding.  Right lower quadrant tenderness without guarding or rebound.  Genitourinary: There is no tenderness on the right labia. There is no tenderness on the left labia. Cervix exhibits no motion  tenderness. Right adnexum displays no mass and no tenderness. Left adnexum displays no tenderness. No vaginal discharge found.  IUD strings in place  Musculoskeletal: Normal range of motion. She exhibits no edema and no tenderness.  No CVA tenderness  Neurological: She is alert. No cranial nerve deficit. Coordination normal.  Skin: Skin is warm.    ED Course  Procedures (including critical care time)  Labs Reviewed  WET PREP, GENITAL - Abnormal; Notable for the following:    WBC, Wet Prep HPF POC FEW (*)    All other components within normal limits  URINALYSIS, MICROSCOPIC ONLY - Abnormal; Notable for the following:    APPearance CLOUDY (*)    Hgb urine dipstick SMALL (*)    Protein, ur 30 (*)    Leukocytes, UA MODERATE (*)    Bacteria, UA MANY (*)    Squamous Epithelial / LPF MANY (*)    All other components within normal limits  URINE CULTURE  GC/CHLAMYDIA PROBE AMP  CBC WITH DIFFERENTIAL  COMPREHENSIVE METABOLIC PANEL  URINALYSIS, ROUTINE W REFLEX MICROSCOPIC  POCT PREGNANCY, URINE   Ct Abdomen Pelvis W Contrast  03/25/2013  *RADIOLOGY REPORT*  Clinical Data: Flank pain.  Abdominal pain.  CT ABDOMEN AND PELVIS WITH CONTRAST  Technique:  Multidetector CT imaging of the abdomen and pelvis was performed following the standard protocol during bolus administration of intravenous contrast.  Contrast: 50mL OMNIPAQUE IOHEXOL 300 MG/ML  SOLN, OMNIPAQUE IOHEXOL 300 MG/ML  SOLN  Comparison: 08/05/2011  Findings: No pericardial or pleural effusion noted.  The lung bases are clear.  No focal liver abnormalities identified.  The gallbladder is normal.  There is no biliary dilatation.  Normal appearance of the pancreas.  The spleen is within normal limits.  The adrenal glands are both normal.  Multiple bilateral renal cysts are identified. Bilateral renal stones noted.  Within the inferior pole the left kidney there is a 1 cm stone, image number 34/series 3. Within the upper pole the right  kidney there is a stone which measures 2 mm, image 68/series 5.  6 mm stone is noted within the lower pole the right kidney.  The urinary bladder appears normal. There is an IUD within the uterus.  Subserosal fibroid arising from the fundus measures 2 cm, image 77/series 3.  Normal caliber of the abdominal aorta.  There is no enlarged upper abdominal lymph nodes.  No pelvic or inguinal adenopathy identified.  The stomach appears normal.  The small bowel loops have a normal course and caliber.  The appendix is visualized and appears normal.  There are scattered distal colonic diverticula without acute inflammation.  No free fluid or fluid collection within the abdomen or pelvis.  Review of the visualized osseous structures is unremarkable.  No worrisome lytic or sclerotic bone lesions.  IMPRESSION:  1.  No acute findings. 2.  Bilateral nonobstructing renal calculi. 3.  Bilateral renal cysts.   Original Report Authenticated By: Signa Kell, M.D.      No diagnosis found.    MDM  Abdominal pain and right flank pain with history of kidney stones. No peritoneal signs on exam.  Urinalysis appears contaminated. Pelvic exam benign.  Appendix normal on CT. Nonobstructing bilateral renal stones. Urinalysis repeated with catheterized sample.  Repeat UA negative.  No obvious source of abdominal and flank discomfort. Patient may have passed a kidney stone.  She is tolerating PO and stable for followup with her urologist. Dr Vernie Ammons.  Glynn Octave, MD 03/25/13 [phone]

## 2013-03-25 NOTE — ED Notes (Signed)
Patient transported to CT 

## 2013-03-25 NOTE — ED Notes (Signed)
Pt c/o rt flank/gen abd pain x 5 days.  States hx of kidney stones.

## 2013-03-26 LAB — GC/CHLAMYDIA PROBE AMP
CT Probe RNA: NEGATIVE
GC Probe RNA: NEGATIVE

## 2013-03-29 LAB — URINE CULTURE

## 2013-03-30 ENCOUNTER — Telehealth (HOSPITAL_COMMUNITY): Payer: Self-pay | Admitting: Emergency Medicine

## 2013-03-30 NOTE — ED Notes (Signed)
+   Urine Chart sent to EDP office for review. 

## 2013-03-30 NOTE — ED Notes (Signed)
Patient has +Urine culture. °

## 2013-04-01 ENCOUNTER — Telehealth (HOSPITAL_COMMUNITY): Payer: Self-pay | Admitting: Emergency Medicine

## 2013-04-01 NOTE — ED Notes (Signed)
Chart returned from EDP office. Per Robert Browning PA-C, start Keflex 500 mg QID x 7 days. #28. °

## 2013-10-08 ENCOUNTER — Institutional Professional Consult (permissible substitution): Payer: Self-pay | Admitting: Medical

## 2013-10-25 ENCOUNTER — Emergency Department (HOSPITAL_COMMUNITY): Payer: PRIVATE HEALTH INSURANCE

## 2013-10-25 ENCOUNTER — Emergency Department (HOSPITAL_COMMUNITY)
Admission: EM | Admit: 2013-10-25 | Discharge: 2013-10-25 | Disposition: A | Discharge status: Home / Self Care | Payer: PRIVATE HEALTH INSURANCE | Attending: Emergency Medicine | Admitting: Emergency Medicine

## 2013-10-25 ENCOUNTER — Encounter (HOSPITAL_COMMUNITY): Payer: Self-pay | Admitting: Emergency Medicine

## 2013-10-25 DIAGNOSIS — Z87442 Personal history of urinary calculi: Secondary | ICD-10-CM | POA: Insufficient documentation

## 2013-10-25 DIAGNOSIS — Z79899 Other long term (current) drug therapy: Secondary | ICD-10-CM | POA: Insufficient documentation

## 2013-10-25 DIAGNOSIS — R109 Unspecified abdominal pain: Secondary | ICD-10-CM | POA: Insufficient documentation

## 2013-10-25 DIAGNOSIS — Z8742 Personal history of other diseases of the female genital tract: Secondary | ICD-10-CM | POA: Insufficient documentation

## 2013-10-25 DIAGNOSIS — Z8719 Personal history of other diseases of the digestive system: Secondary | ICD-10-CM | POA: Insufficient documentation

## 2013-10-25 DIAGNOSIS — Z3202 Encounter for pregnancy test, result negative: Secondary | ICD-10-CM | POA: Insufficient documentation

## 2013-10-25 DIAGNOSIS — R350 Frequency of micturition: Secondary | ICD-10-CM | POA: Insufficient documentation

## 2013-10-25 LAB — URINALYSIS, ROUTINE W REFLEX MICROSCOPIC
Glucose, UA: NEGATIVE mg/dL
Protein, ur: NEGATIVE mg/dL
Specific Gravity, Urine: 1.018 (ref 1.005–1.030)
Urobilinogen, UA: 0.2 mg/dL (ref 0.0–1.0)

## 2013-10-25 LAB — COMPREHENSIVE METABOLIC PANEL
ALT: 32 U/L (ref 0–35)
AST: 26 U/L (ref 0–37)
CO2: 27 mEq/L (ref 19–32)
Calcium: 9.4 mg/dL (ref 8.4–10.5)
Creatinine, Ser: 0.77 mg/dL (ref 0.50–1.10)
GFR calc Af Amer: >90 mL/min (ref >90)
GFR calc non Af Amer: >90 mL/min (ref >90)
Sodium: 135 mEq/L (ref 135–145)
Total Protein: 7.6 g/dL (ref 6.0–8.3)

## 2013-10-25 LAB — CBC WITH DIFFERENTIAL/PLATELET
Basophils Absolute: 0 10*3/uL (ref 0.0–0.1)
Basophils Relative: 0 % (ref 0–1)
Eosinophils Absolute: 0.1 10*3/uL (ref 0.0–0.7)
Eosinophils Relative: 1 % (ref 0–5)
HCT: 39.1 % (ref 36.0–46.0)
Lymphocytes Relative: 32 % (ref 12–46)
MCH: 31.9 pg (ref 26.0–34.0)
MCHC: 34.5 g/dL (ref 30.0–36.0)
MCV: 92.4 fL (ref 78.0–100.0)
Monocytes Absolute: 0.7 10*3/uL (ref 0.1–1.0)
Platelets: 299 10*3/uL (ref 150–400)
RDW: 12.3 % (ref 11.5–15.5)
WBC: 9.3 10*3/uL (ref 4.0–10.5)

## 2013-10-25 LAB — URINE MICROSCOPIC-ADD ON

## 2013-10-25 LAB — POCT PREGNANCY, URINE: Preg Test, Ur: NEGATIVE

## 2013-10-25 MED ORDER — KETOROLAC TROMETHAMINE 30 MG/ML IJ SOLN
30.0000 mg | Freq: Once | INTRAMUSCULAR | Status: AC
Start: 2013-10-25 — End: 2013-10-25
  Administered 2013-10-25: 30 mg via INTRAVENOUS
  Filled 2013-10-25: qty 1

## 2013-10-25 MED ORDER — ONDANSETRON HCL 4 MG/2ML IJ SOLN
4.0000 mg | Freq: Once | INTRAMUSCULAR | Status: AC
  Administered 2013-10-25: 4 mg via INTRAVENOUS
  Filled 2013-10-25: qty 2

## 2013-10-25 MED ORDER — CEPHALEXIN 500 MG PO CAPS: 500.0000 mg | ORAL_CAPSULE | Freq: Four times a day (QID) | ORAL | Status: DC

## 2013-10-25 MED ORDER — HYDROCODONE-ACETAMINOPHEN 5-325 MG PO TABS: 2.0000 | ORAL_TABLET | Freq: Four times a day (QID) | ORAL | Status: DC | PRN

## 2013-10-25 MED ORDER — HYDROMORPHONE HCL PF 1 MG/ML IJ SOLN
1.0000 mg | Freq: Once | INTRAMUSCULAR | Status: AC
  Administered 2013-10-25: 1 mg via INTRAVENOUS
  Filled 2013-10-25: qty 1

## 2013-10-25 NOTE — ED Notes (Signed)
Pt states that she has had the abdominal pain for month and half. Pt states that she does have history of kidney stones. Pt also c/o urinary frequency. Went to urgent care three weeks ago and found nothing in urine but did see stones in her kidneys but couldn't decide why she was having the lateral abd pain. Pt was supposed to see her urologist close to her birthday but wasn't having the pain anymore so she canceled the appt but since the pain has resumed.

## 2013-10-25 NOTE — ED Provider Notes (Signed)
CSN: [id number]     Arrival date & time 10/25/13  1826 History   First MD Initiated Contact with Patient 10/25/13 1908     Chief Complaint  Patient presents with  . Abdominal Pain   (Consider location/radiation/quality/duration/timing/severity/associated sxs/prior Treatment) HPI Comments: Patient presents to the emergency department with chief complaint of abdominal pain. She states that she has had intermittent abdominal pain for the past month and half. She states that she has a history of kidney stones. She states that she has never been able to pass a kidney stone, but has always had to have surgical intervention. She states that the abdominal pain feels similar to the kidney stones, but is slightly worsened. She states that she had a followup appointment with her urologist, but had a few days of relief, so she canceled the appointment. She has not tried taking anything to alleviate her symptoms. She endorses urinary frequency, but denies dysuria or obvious hematuria. She denies fevers, chills, nausea, or vomiting.  The history is provided by the patient. No language interpreter was used.    Past Medical History  Diagnosis Date  . Left ureteral calculus   . Renal stones   . History of kidney stones   . Chronic constipation   . Esophageal reflux   . Personal history of urinary calculi   . Abdominal pain, generalized   . Microscopic hematuria    Past Surgical History  Procedure Laterality Date  . Percutanous nephrostolithotomy  10-08-2011    LEFT  . Percutaneous nephrostolithotomy  09-03-2011    RIGHT  . Right ureteroscopic stone extraction  11-26-2005  . Cysto/ bilateral ureteral stent placement  08-05-2011  . Repeat cesarean section  04-13-2002    W/ TUBAL LIGATION  . Breast enhancement surgery    . Lithothripsy    . Renal endoscopy via nephrostomy / pyelostomy     Family History  Problem Relation Age of Onset  . Cancer Maternal Grandfather     lung   History   Substance Use Topics  . Smoking status: Never Smoker   . Smokeless tobacco: Not on file  . Alcohol Use: Yes     Comment: social   OB History   Grav Para Term Preterm Abortions TAB SAB Ect Mult Living                 Review of Systems  All other systems reviewed and are negative.    Allergies  Codeine  Home Medications   Current Outpatient Rx  Name  Route  Sig  Dispense  Refill  . acetaminophen (TYLENOL) 500 MG tablet   Oral   Take 500 mg by mouth every 6 (six) hours as needed (pain).         . chlorthalidone (HYGROTON) 25 MG tablet   Oral   Take 25 mg by mouth daily.         . naproxen (NAPROSYN) 250 MG tablet   Oral   Take 250 mg by mouth 2 (two) times daily between meals as needed (pain).          BP 134/91  Pulse 65  Temp(Src) 98 F (36.7 C) (Oral)  Resp 18  SpO2 100% Physical Exam  Nursing note and vitals reviewed. Constitutional: She is oriented to person, place, and time. She appears well-developed and well-nourished.  HENT:  Head: Normocephalic and atraumatic.  Eyes: Conjunctivae and EOM are normal. Pupils are equal, round, and reactive to light.  Neck: Normal range of motion. Neck  supple.  Cardiovascular: Normal rate and regular rhythm.  Exam reveals no gallop and no friction rub.   No murmur heard. Pulmonary/Chest: Effort normal and breath sounds normal. No respiratory distress. She has no wheezes. She has no rales. She exhibits no tenderness.  Abdominal: Soft. Bowel sounds are normal. She exhibits no distension and no mass. There is no tenderness. There is no rebound and no guarding.  Abdomen is benign, no focal abdominal tenderness, no signs of fluid wave or signs of peritonitis  Musculoskeletal: Normal range of motion. She exhibits no edema and no tenderness.  Neurological: She is alert and oriented to person, place, and time.  Skin: Skin is warm and dry.  Psychiatric: She has a normal mood and affect. Her behavior is normal. Judgment and  thought content normal.    ED Course  Procedures (including critical care time) Results for orders placed during the hospital encounter of 10/25/13  CBC WITH DIFFERENTIAL      Result Value Range   WBC 9.3  4.0 - 10.5 K/uL   RBC 4.23  3.87 - 5.11 MIL/uL   Hemoglobin 13.5  12.0 - 15.0 g/dL   HCT 16.1  09.6 - 04.5 %   MCV 92.4  78.0 - 100.0 fL   MCH 31.9  26.0 - 34.0 pg   MCHC 34.5  30.0 - 36.0 g/dL   RDW 40.9  81.1 - 91.4 %   Platelets 299  150 - 400 K/uL   Neutrophils Relative % 60  43 - 77 %   Neutro Abs 5.5  1.7 - 7.7 K/uL   Lymphocytes Relative 32  12 - 46 %   Lymphs Abs 2.9  0.7 - 4.0 K/uL   Monocytes Relative 8  3 - 12 %   Monocytes Absolute 0.7  0.1 - 1.0 K/uL   Eosinophils Relative 1  0 - 5 %   Eosinophils Absolute 0.1  0.0 - 0.7 K/uL   Basophils Relative 0  0 - 1 %   Basophils Absolute 0.0  0.0 - 0.1 K/uL  COMPREHENSIVE METABOLIC PANEL      Result Value Range   Sodium 135  135 - 145 mEq/L   Potassium 3.3 (*) 3.5 - 5.1 mEq/L   Chloride 98  96 - 112 mEq/L   CO2 27  19 - 32 mEq/L   Glucose, Bld 92  70 - 99 mg/dL   BUN 25 (*) 6 - 23 mg/dL   Creatinine, Ser 7.82  0.50 - 1.10 mg/dL   Calcium 9.4  8.4 - 95.6 mg/dL   Total Protein 7.6  6.0 - 8.3 g/dL   Albumin 4.3  3.5 - 5.2 g/dL   AST 26  0 - 37 U/L   ALT 32  0 - 35 U/L   Alkaline Phosphatase 61  39 - 117 U/L   Total Bilirubin 0.5  0.3 - 1.2 mg/dL   GFR calc non Af Amer >90  >90 mL/min   GFR calc Af Amer >90  >90 mL/min  LIPASE, BLOOD      Result Value Range   Lipase 58  11 - 59 U/L  URINALYSIS, ROUTINE W REFLEX MICROSCOPIC      Result Value Range   Color, Urine YELLOW  YELLOW   APPearance CLEAR  CLEAR   Specific Gravity, Urine 1.018  1.005 - 1.030   pH 6.0  5.0 - 8.0   Glucose, UA NEGATIVE  NEGATIVE mg/dL   Hgb urine dipstick MODERATE (*) NEGATIVE   Bilirubin  Urine NEGATIVE  NEGATIVE   Ketones, ur NEGATIVE  NEGATIVE mg/dL   Protein, ur NEGATIVE  NEGATIVE mg/dL   Urobilinogen, UA 0.2  0.0 - 1.0 mg/dL    Nitrite NEGATIVE  NEGATIVE   Leukocytes, UA SMALL (*) NEGATIVE  URINE MICROSCOPIC-ADD ON      Result Value Range   Squamous Epithelial / LPF RARE  RARE   WBC, UA 7-10  <3 WBC/hpf   RBC / HPF 3-6  <3 RBC/hpf  POCT PREGNANCY, URINE      Result Value Range   Preg Test, Ur NEGATIVE  NEGATIVE   Ct Abdomen Pelvis Wo Contrast  10/25/2013   CLINICAL DATA:  Right flank pain, evaluate for stone.  EXAM: CT ABDOMEN AND PELVIS WITHOUT CONTRAST  TECHNIQUE: Multidetector CT imaging of the abdomen and pelvis was performed following the standard protocol without intravenous contrast.  COMPARISON:  CT of the abdomen and pelvis March 25, 2013  FINDINGS: Included view of the lung bases are clear. The visualized heart and pericardium are unremarkable.  The kidneys are orthotopic. Bilateral nephrolithiasis, including 8 mm low left interpolar calculus, punctate left interpolar calculus, 5 mm right lower pole renal calculus, 4 mm left right upper pole renal calculus. Faint by a lateral renal medullary calcifications. Mild right hydroureteronephrosis and extra renal pelvis. No perinephric stranding or fluid collections. Urinary bladder is well distended, unremarkable without intravesicular calculi. Phleboliths in the pelvis.  The liver, spleen, gallbladder, pancreas and adrenal glands are unremarkable for this non-contrast examination.  The stomach, small and large bowel are normal in course and caliber without inflammatory changes, the sensitivity may be decreased by lack of enteric contrast. Normal appendix. No intraperitoneal free fluid nor free air.  Great vessels are normal in course and caliber. No lymphadenopathy by CT size criteria. Internal reproductive organs are nonsuspicious ; intrauterine device appears well seated centrally within the uterus. The soft tissues and included osseous structures are nonsuspicious. Bilateral breast implants.  IMPRESSION: Bilateral nonobstructing nephrolithiasis, measuring up to 8 mm in  the upper pole of the left kidney. Mild right hydroureteronephrosis may reflect recently passed stone without intravesicular calculi. Very mild bilateral medullary nephrocalcinosis.   Electronically Signed   By: Awilda Metro   On: 10/25/2013 20:43     EKG Interpretation   None       MDM   1. Abdominal  pain, other specified site     Patient with history of kidney stones, now complaining of worsening flank pain. CT is negative for kidney stones in the ureter, but does show kidney stones and bilateral kidneys. Labs are reassuring. Patient is hemodynamically stable. Pain is controlled. Will treat with Keflex and some pain medicine, will give Keflex just in case she has an underlying UTI.  Patient will follow-up with urology. She understands and agrees with the plan.  Patient is stable and ready for discharge.   Roxy Horseman, PA-C 10/25/13 2329

## 2013-10-26 ENCOUNTER — Encounter: Payer: Self-pay | Admitting: Gastroenterology

## 2013-10-27 NOTE — ED Provider Notes (Signed)
Medical screening examination/treatment/procedure(s) were performed by non-physician practitioner and as supervising physician I was immediately available for consultation/collaboration.  Jahleel Stroschein L Garnett Rekowski, MD 10/27/13 0707 

## 2013-11-30 ENCOUNTER — Ambulatory Visit: Payer: PRIVATE HEALTH INSURANCE | Admitting: Gastroenterology

## 2015-06-05 ENCOUNTER — Emergency Department (HOSPITAL_COMMUNITY): Payer: No Typology Code available for payment source

## 2015-06-05 ENCOUNTER — Emergency Department (HOSPITAL_COMMUNITY)
Admission: EM | Admit: 2015-06-05 | Discharge: 2015-06-05 | Disposition: A | Discharge status: Home / Self Care | Payer: No Typology Code available for payment source | Attending: Emergency Medicine | Admitting: Emergency Medicine

## 2015-06-05 ENCOUNTER — Encounter (HOSPITAL_COMMUNITY): Payer: Self-pay | Admitting: Family Medicine

## 2015-06-05 DIAGNOSIS — Z87442 Personal history of urinary calculi: Secondary | ICD-10-CM | POA: Diagnosis not present

## 2015-06-05 DIAGNOSIS — R319 Hematuria, unspecified: Secondary | ICD-10-CM | POA: Diagnosis not present

## 2015-06-05 DIAGNOSIS — Z791 Long term (current) use of non-steroidal anti-inflammatories (NSAID): Secondary | ICD-10-CM | POA: Diagnosis not present

## 2015-06-05 DIAGNOSIS — Z3202 Encounter for pregnancy test, result negative: Secondary | ICD-10-CM | POA: Diagnosis not present

## 2015-06-05 DIAGNOSIS — Z79899 Other long term (current) drug therapy: Secondary | ICD-10-CM | POA: Insufficient documentation

## 2015-06-05 DIAGNOSIS — R1031 Right lower quadrant pain: Secondary | ICD-10-CM | POA: Diagnosis present

## 2015-06-05 DIAGNOSIS — D259 Leiomyoma of uterus, unspecified: Secondary | ICD-10-CM | POA: Diagnosis not present

## 2015-06-05 DIAGNOSIS — R102 Pelvic and perineal pain: Secondary | ICD-10-CM

## 2015-06-05 LAB — COMPREHENSIVE METABOLIC PANEL
ALBUMIN: 4.4 g/dL (ref 3.5–5.0)
ALT: 18 U/L (ref 14–54)
ANION GAP: 8 (ref 5–15)
AST: 17 U/L (ref 15–41)
Alkaline Phosphatase: 51 U/L (ref 38–126)
BILIRUBIN TOTAL: 1 mg/dL (ref 0.3–1.2)
BUN: 17 mg/dL (ref 6–20)
CHLORIDE: 101 mmol/L (ref 101–111)
CO2: 28 mmol/L (ref 22–32)
Calcium: 9.1 mg/dL (ref 8.9–10.3)
Creatinine, Ser: 0.87 mg/dL (ref 0.44–1.00)
GFR calc Af Amer: >60 mL/min (ref >60)
GFR calc non Af Amer: >60 mL/min (ref >60)
Glucose, Bld: 96 mg/dL (ref 65–99)
POTASSIUM: 3.8 mmol/L (ref 3.5–5.1)
Sodium: 137 mmol/L (ref 135–145)
Total Protein: 7.8 g/dL (ref 6.5–8.1)

## 2015-06-05 LAB — CBC WITH DIFFERENTIAL/PLATELET
Basophils Absolute: 0 10*3/uL (ref 0.0–0.1)
Basophils Relative: 0 % (ref 0–1)
Eosinophils Absolute: 0.1 10*3/uL (ref 0.0–0.7)
Eosinophils Relative: 2 % (ref 0–5)
HEMATOCRIT: 44.5 % (ref 36.0–46.0)
Hemoglobin: 14.4 g/dL (ref 12.0–15.0)
LYMPHS PCT: 30 % (ref 12–46)
Lymphs Abs: 1.4 10*3/uL (ref 0.7–4.0)
MCH: 30.8 pg (ref 26.0–34.0)
MCHC: 32.4 g/dL (ref 30.0–36.0)
MCV: 95.1 fL (ref 78.0–100.0)
MONO ABS: 0.4 10*3/uL (ref 0.1–1.0)
Monocytes Relative: 9 % (ref 3–12)
NEUTROS ABS: 2.8 10*3/uL (ref 1.7–7.7)
NEUTROS PCT: 59 % (ref 43–77)
Platelets: 293 10*3/uL (ref 150–400)
RBC: 4.68 MIL/uL (ref 3.87–5.11)
RDW: 12.8 % (ref 11.5–15.5)
WBC: 4.8 10*3/uL (ref 4.0–10.5)

## 2015-06-05 LAB — URINE MICROSCOPIC-ADD ON

## 2015-06-05 LAB — WET PREP, GENITAL
Clue Cells Wet Prep HPF POC: NONE SEEN
Trich, Wet Prep: NONE SEEN
Yeast Wet Prep HPF POC: NONE SEEN

## 2015-06-05 LAB — URINALYSIS, ROUTINE W REFLEX MICROSCOPIC
BILIRUBIN URINE: NEGATIVE
Glucose, UA: NEGATIVE mg/dL
KETONES UR: NEGATIVE mg/dL
Nitrite: NEGATIVE
PH: 7.5 (ref 5.0–8.0)
Protein, ur: NEGATIVE mg/dL
SPECIFIC GRAVITY, URINE: 1.016 (ref 1.005–1.030)
Urobilinogen, UA: 1 mg/dL (ref 0.0–1.0)

## 2015-06-05 LAB — LIPASE, BLOOD: Lipase: 34 U/L (ref 22–51)

## 2015-06-05 LAB — POC URINE PREG, ED: Preg Test, Ur: NEGATIVE

## 2015-06-05 MED ORDER — HYDROCODONE-ACETAMINOPHEN 5-325 MG PO TABS: 1.0000 | ORAL_TABLET | Freq: Four times a day (QID) | ORAL | Status: DC | PRN

## 2015-06-05 MED ORDER — MORPHINE SULFATE 4 MG/ML IJ SOLN
4.0000 mg | Freq: Once | INTRAMUSCULAR | Status: AC
  Administered 2015-06-05: 4 mg via INTRAVENOUS
  Filled 2015-06-05: qty 1

## 2015-06-05 MED ORDER — ONDANSETRON HCL 4 MG/2ML IJ SOLN
4.0000 mg | Freq: Once | INTRAMUSCULAR | Status: AC
  Administered 2015-06-05: 4 mg via INTRAVENOUS
  Filled 2015-06-05: qty 2

## 2015-06-05 NOTE — ED Notes (Signed)
Pt back from CT

## 2015-06-05 NOTE — ED Notes (Signed)
Patient is complaining of right lower abd that has been a dull constant pain for a month. Two days ago, patient reports experiencing hematuria. Denies fever, nausea, and vomiting. Pt has took Tylenol and Aleve for the pain with no relief.

## 2015-06-05 NOTE — ED Provider Notes (Signed)
CSN: [id number]     Arrival date & time 06/05/15  1000 History   First MD Initiated Contact with Patient 06/05/15 1049     Chief Complaint  Patient presents with  . Abdominal Pain  . Hematuria     (Consider location/radiation/quality/duration/timing/severity/associated sxs/prior Treatment) HPI Comments: Patient presents today with a chief complaint of lower abdominal pain.  She states that the pain has been intermittent over the past month.  She describes the pain as a pressure.  She reports that this morning she noticed some dark red blood while wiping after urination. She is unsure if the blood was vaginal bleeding or hematuria.  She reports a history of kidney stones, but states that the pain does not feel like pain that she has had with kidney stones in the past.   She has not taken anything for pain prior to arrival.  She denies nausea, vomiting, diarrhea, constipation, urinary symptoms, vaginal discharge, fever, or chills.    Patient is a 44 y.o. female presenting with abdominal pain and hematuria. The history is provided by the patient.  Abdominal Pain Associated symptoms: hematuria   Hematuria Associated symptoms include abdominal pain.    Past Medical History  Diagnosis Date  . Left ureteral calculus   . Renal stones   . History of kidney stones   . Chronic constipation   . Esophageal reflux   . Personal history of urinary calculi   . Abdominal pain, generalized   . Microscopic hematuria    Past Surgical History  Procedure Laterality Date  . Percutanous nephrostolithotomy  10-08-2011    LEFT  . Percutaneous nephrostolithotomy  09-03-2011    RIGHT  . Right ureteroscopic stone extraction  11-26-2005  . Cysto/ bilateral ureteral stent placement  08-05-2011  . Repeat cesarean section  04-13-2002    W/ TUBAL LIGATION  . Breast enhancement surgery    . Lithothripsy    . Renal endoscopy via nephrostomy / pyelostomy     Family History  Problem Relation Age of Onset  .  Cancer Maternal Grandfather     lung   History  Substance Use Topics  . Smoking status: Never Smoker   . Smokeless tobacco: Not on file  . Alcohol Use: Yes     Comment: 1-2 times a month   OB History    No data available     Review of Systems  Gastrointestinal: Positive for abdominal pain.  Genitourinary: Positive for hematuria.  All other systems reviewed and are negative.     Allergies  Review of patient's allergies indicates no active allergies.  Home Medications   Prior to Admission medications   Medication Sig Start Date End Date Taking? Authorizing Provider  acetaminophen (TYLENOL) 500 MG tablet Take 500 mg by mouth every 6 (six) hours as needed (pain).   Yes Historical Provider, MD  naproxen (NAPROSYN) 250 MG tablet Take 250 mg by mouth 2 (two) times daily between meals as needed (pain).   Yes Historical Provider, MD  naproxen sodium (ANAPROX) 220 MG tablet Take 440 mg by mouth 2 (two) times daily as needed (pain).   Yes Historical Provider, MD  chlorthalidone (HYGROTON) 25 MG tablet Take 25 mg by mouth daily.    Historical Provider, MD   BP 126/86 mmHg  Pulse 70  Temp(Src) 98.3 F (36.8 C) (Oral)  Resp 16  Ht 5\' 4"  (1.626 m)  Wt 145 lb (65.772 kg)  BMI 24.88 kg/m2  SpO2 94% Physical Exam  Constitutional: She appears well-developed  and well-nourished.  HENT:  Head: Normocephalic and atraumatic.  Mouth/Throat: Oropharynx is clear and moist.  Neck: Normal range of motion. Neck supple.  Cardiovascular: Normal rate, regular rhythm and normal heart sounds.   Pulmonary/Chest: Effort normal and breath sounds normal.  Abdominal: Soft. Bowel sounds are normal. She exhibits no distension and no mass. There is generalized tenderness. There is no rigidity, no rebound, no guarding, no CVA tenderness and no tenderness at McBurney's point.  Negative Rovsing's sign  Genitourinary: Cervix exhibits no motion tenderness. Right adnexum displays tenderness. Right adnexum  displays no mass and no fullness. Left adnexum displays no mass, no tenderness and no fullness.  Small amount of brownish colored blood in the vaginal vault.   Musculoskeletal: Normal range of motion.  Neurological: She is alert.  Skin: Skin is warm and dry.  Psychiatric: She has a normal mood and affect.  Nursing note and vitals reviewed.   ED Course  Procedures (including critical care time) Labs Review Labs Reviewed  URINALYSIS, ROUTINE W REFLEX MICROSCOPIC (NOT AT [facility]) - Abnormal; Notable for the following:    APPearance CLOUDY (*)    Hgb urine dipstick MODERATE (*)    Leukocytes, UA SMALL (*)    All other components within normal limits  URINE MICROSCOPIC-ADD ON - Abnormal; Notable for the following:    Bacteria, UA FEW (*)    All other components within normal limits  WET PREP, GENITAL  CBC WITH DIFFERENTIAL/PLATELET  COMPREHENSIVE METABOLIC PANEL  LIPASE, BLOOD  POC URINE PREG, ED  GC/CHLAMYDIA PROBE AMP ([place]) NOT AT [facility]    Imaging Review US Transvaginal Non-ob  06/05/2015   CLINICAL DATA:  Lower abdomen and pelvic pain  EXAM: TRANSABDOMINAL AND TRANSVAGINAL ULTRASOUND OF PELVIS  TECHNIQUE: Study was performed transabdominally to optimize pelvic field of view evaluation and transvaginally to optimize internal visceral architecture evaluation.  COMPARISON:  CT abdomen and pelvis October 25, 2013  FINDINGS: Uterus  Measurements: 8.8 x 4.3 x 5.0 cm. There is a hypoechoic mass in the mid uterine fundus region measuring 1.9 x 1.7 x 2.0 cm consistent with a leiomyoma. No other focal intrauterine mass is identified. The echotexture of the uterus is somewhat inhomogeneous which suggests that there may be more generalized leiomyomatous change without other well-defined focal mass.  Endometrium  Thickness: 6 mm. Contour is smooth. There is an intrauterine device present.  Right ovary  Measurements: 2.7 x 2.4 x 2.3 cm. Normal appearance/no adnexal mass.  Left ovary  Measurements:  2.4 x 2.4 x 2.1 cm. Normal appearance/no adnexal mass.  Other findings  No free fluid.  IMPRESSION: Focal leiomyoma measuring 1.9 x 1.7 x 2.0 cm in the mid uterus. Somewhat inhomogeneous appearance of the uterine myometrial echotexture suggests that there may be more generalized leiomyomatous change without other well-defined mass.  Intrauterine device within the endometrium.  Study otherwise unremarkable.   Electronically Signed   By: Lowella Grip III M.D.   On: 06/05/2015 13:21   US Pelvis Complete  06/05/2015   CLINICAL DATA:  Lower abdomen and pelvic pain  EXAM: TRANSABDOMINAL AND TRANSVAGINAL ULTRASOUND OF PELVIS  TECHNIQUE: Study was performed transabdominally to optimize pelvic field of view evaluation and transvaginally to optimize internal visceral architecture evaluation.  COMPARISON:  CT abdomen and pelvis October 25, 2013  FINDINGS: Uterus  Measurements: 8.8 x 4.3 x 5.0 cm. There is a hypoechoic mass in the mid uterine fundus region measuring 1.9 x 1.7 x 2.0 cm consistent with a leiomyoma. No other  focal intrauterine mass is identified. The echotexture of the uterus is somewhat inhomogeneous which suggests that there may be more generalized leiomyomatous change without other well-defined focal mass.  Endometrium  Thickness: 6 mm. Contour is smooth. There is an intrauterine device present.  Right ovary  Measurements: 2.7 x 2.4 x 2.3 cm. Normal appearance/no adnexal mass.  Left ovary  Measurements: 2.4 x 2.4 x 2.1 cm. Normal appearance/no adnexal mass.  Other findings  No free fluid.  IMPRESSION: Focal leiomyoma measuring 1.9 x 1.7 x 2.0 cm in the mid uterus. Somewhat inhomogeneous appearance of the uterine myometrial echotexture suggests that there may be more generalized leiomyomatous change without other well-defined mass.  Intrauterine device within the endometrium.  Study otherwise unremarkable.   Electronically Signed   By: Lowella Grip III M.D.   On: 06/05/2015 13:21   Ct Renal Stone  Study  06/05/2015   CLINICAL DATA:  Right lower abdominal pain for 1 month. Hematuria for 2 days  EXAM: CT ABDOMEN AND PELVIS WITHOUT CONTRAST  TECHNIQUE: Multidetector CT imaging of the abdomen and pelvis was performed following the standard protocol without oral or intravenous contrast maternal administration.  COMPARISON:  October 25, 2013  FINDINGS: Visualized lung bases are clear. There are breast implants bilaterally.  No focal liver lesions are identified on this noncontrast enhanced study. Gallbladder wall is not appreciably thickened. There is no biliary duct dilatation.  Spleen, pancreas, and adrenals appear normal.  In the left kidney, there are two, 1 mm calculi in the upper pole region. There is an adjacent 2 mm calculus in this area. There are several 1 mm calculi in the mid left kidney. There is a calculus in the mid to lower pole region on the left measuring 1.4 x 1.0 cm. There is no left renal mass or hydronephrosis. There is no demonstrable ureteral calculus on the left.  On the right, there is mild hydronephrosis. There is no right renal mass. There is a calculus in the upper pole of the right kidney measuring 8 x 6 mm. There are several 1 mm calculi in the mid right kidney. There is a 6 x 5 mm calculus with an adjacent 4 x 3 mm calculus in the lower pole region. There is a rather subtle a more fist calculus in the lower pole of the right kidney measuring 9 x 6 mm. There is a 1.3 x 1.0 cm cyst in the lower pole of the right kidney. There is no demonstrable right ureteral calculus.  In the pelvis, urinary bladder is midline with normal wall thickness. There is an intrauterine device within the endometrial region. There is no pelvic mass or pelvic fluid collection. There are multiple pelvic phleboliths which appears stable compared to the prior study. The appendix is not appreciable. There is no periappendiceal region inflammation, however.  There is no bowel obstruction. No free air or portal  venous air. There is a minimal ventral hernia containing only fat. There is no appreciable ascites, adenopathy, or abscess in the abdomen or pelvis. There is no abdominal aortic aneurysm. There are no blastic or lytic bone lesions.  IMPRESSION: Mild hydronephrosis on the right. No obstructing calculus seen. Question recent calculus passage. It should be noted that pyelonephritis on the right could present in this manner and is a differential consideration. There is no evidence suggesting renal abscess or perinephric inflammation.  There are nonobstructing calculi in each kidney. There may be a degree of medullary nephrocalcinosis as well given the somewhat amorphous  nature of several calcifications.  Appendix not seen. No periappendiceal region inflammation. No abscess. No bowel obstruction.  Urinary bladder wall normal in thickness. Intrauterine device within endometrium.   Electronically Signed   By: Lowella Grip III M.D.   On: 06/05/2015 14:51     EKG Interpretation None     2:15 PM Reassessed patient.  Pain is controlled at this time. Discussed results of the pelvic ultrasound.  Patient is requesting CT scan to evaluate for kidney stone.  She reports that she has pain similar to this in the past and has had stones requiring surgical intervention.   MDM   Final diagnoses:  None   Patient presents today with a chief complaint of lower abdominal pain.  She also reports noticing some blood on the toilet paper when she wiped after urination.  Pain has been intermittent over the past month.  Patient with right adnexal tenderness on pelvic exam.  No CMT.  Pelvic ultrasound showing Uterine Fibroid.  Patient also concerned about a possible kidney stone due to history of kidney stones.  CT renal stone study today shows mild right hydronephrosis and possible recent calculus passage.  UA does not show signs of infection.  Patient without fever, flank pain, nausea, or vomiting.  No urinary symptoms aside  from possible hematuria.  Therefore, do not feel that presentation is consistent with Pyelonephritis.  Feel that the patient is stable for discharge.  Patient instructed to follow up with OB/GYN and Urology.  Stable for discharge.  Return precautions given.      Hyman Bible, PA-C 06/06/15 [place], MD 06/07/15 [phone]

## 2015-06-05 NOTE — ED Notes (Signed)
Pt alert and oriented x4. Respirations even and unlabored, bilateral symmetrical rise and fall of chest. Skin warm and dry. In no acute distress. Denies needs.   

## 2015-06-05 NOTE — Discharge Instructions (Signed)
Take pain medication as needed for pain.  Do not drive or operate heavy machinery for 4-6 hours after taking pain medication.

## 2015-06-05 NOTE — ED Notes (Signed)
md at bedside  Pt alert and oriented x4. Respirations even and unlabored, bilateral symmetrical rise and fall of chest. Skin warm and dry. In no acute distress. Denies needs.   

## 2015-06-06 LAB — GC/CHLAMYDIA PROBE AMP (~~LOC~~) NOT AT ARMC
Chlamydia: NEGATIVE
NEISSERIA GONORRHEA: NEGATIVE

## 2015-06-07 LAB — URINE CULTURE: Culture: NO GROWTH

## 2015-09-29 ENCOUNTER — Other Ambulatory Visit: Payer: Self-pay | Admitting: Gastroenterology

## 2015-09-29 DIAGNOSIS — R140 Abdominal distension (gaseous): Secondary | ICD-10-CM

## 2015-09-29 DIAGNOSIS — K219 Gastro-esophageal reflux disease without esophagitis: Secondary | ICD-10-CM

## 2015-10-09 ENCOUNTER — Ambulatory Visit (HOSPITAL_COMMUNITY): Payer: PRIVATE HEALTH INSURANCE

## 2015-10-21 ENCOUNTER — Ambulatory Visit (HOSPITAL_COMMUNITY): Payer: No Typology Code available for payment source

## 2015-10-31 ENCOUNTER — Other Ambulatory Visit: Payer: Self-pay | Admitting: Urology

## 2015-10-31 ENCOUNTER — Ambulatory Visit
Admission: RE | Admit: 2015-10-31 | Discharge: 2015-10-31 | Disposition: A | Discharge status: Home / Self Care | Payer: No Typology Code available for payment source | Source: Ambulatory Visit | Attending: Urology | Admitting: Urology

## 2015-10-31 DIAGNOSIS — N200 Calculus of kidney: Secondary | ICD-10-CM

## 2015-11-03 ENCOUNTER — Other Ambulatory Visit: Payer: Self-pay | Admitting: Urology

## 2015-11-03 DIAGNOSIS — R319 Hematuria, unspecified: Secondary | ICD-10-CM

## 2015-11-03 DIAGNOSIS — R109 Unspecified abdominal pain: Secondary | ICD-10-CM

## 2017-05-10 IMAGING — CT CT RENAL STONE PROTOCOL
2 of 3 series · 15 of 30 positions shown, 17 images · non-contrast
Comparison: October 25, 2013

CLINICAL DATA: Right lower abdominal pain for 1 month. Hematuria
for 2 days

EXAM:
CT ABDOMEN AND PELVIS WITHOUT CONTRAST
TECHNIQUE: Multidetector CT imaging of the abdomen and pelvis was performed
following the standard protocol without oral or intravenous contrast
maternal administration.

[Series 4: lung · axial · 0.65mm/px · z∈[-48,+17]mm · 12 of 16 slices shown, 14 images]
[im 2/16  soft-tissue]
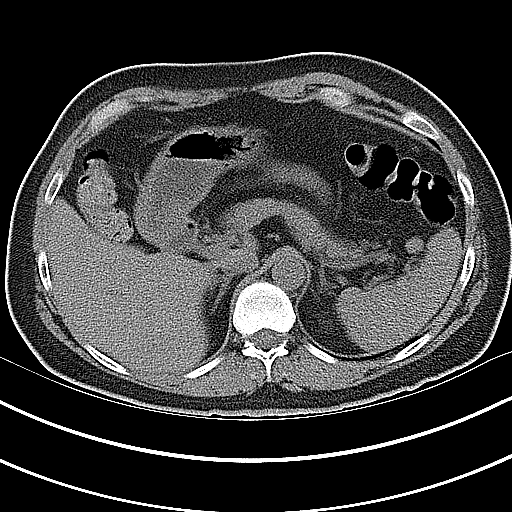
[im 2/16  bone]
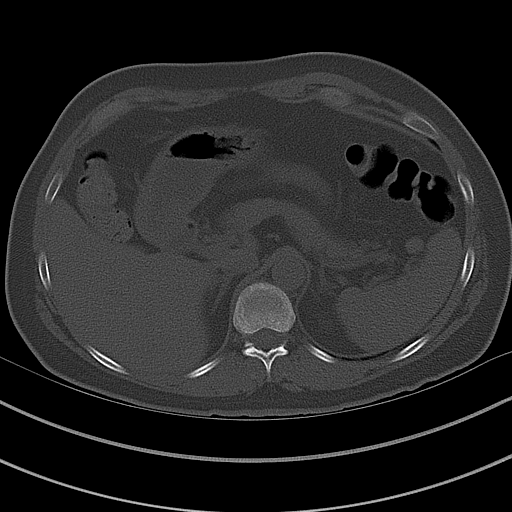
[im 3/16  soft-tissue]
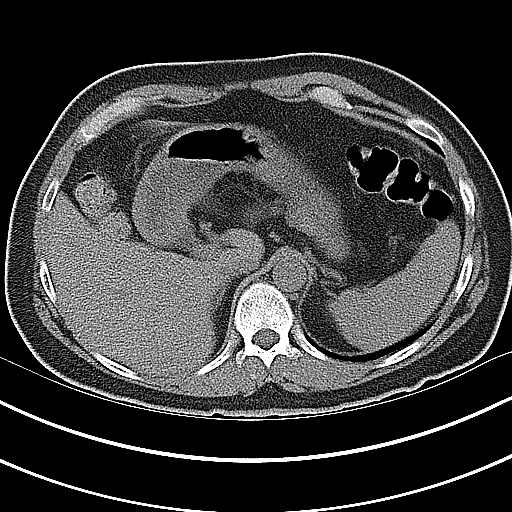
[im 4/16  soft-tissue]
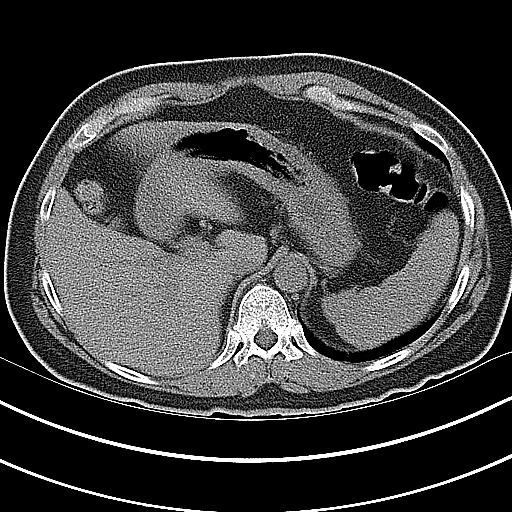
[im 6/16  soft-tissue]
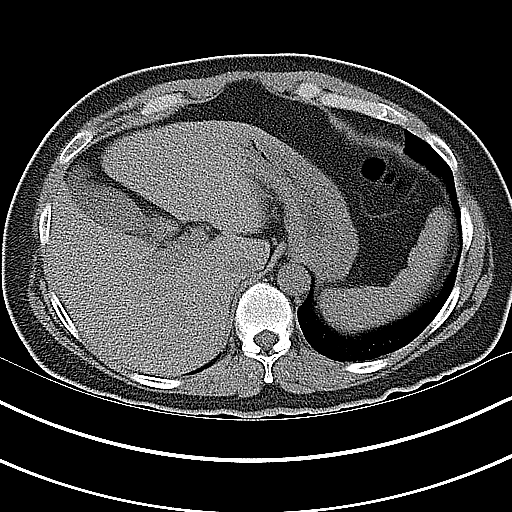
[im 7/16  soft-tissue]
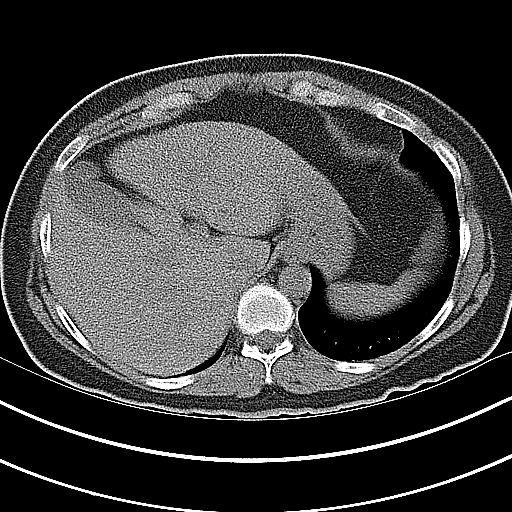
[im 8/16  soft-tissue]
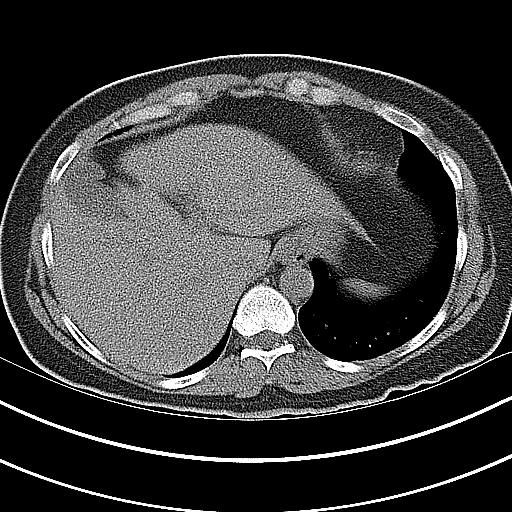
[im 9/16  soft-tissue]
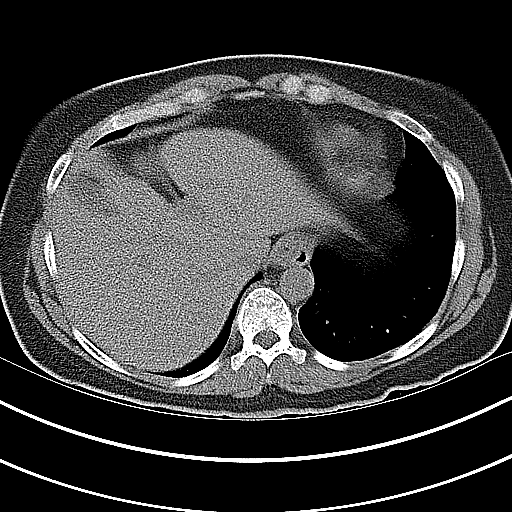
[im 10/16  soft-tissue]
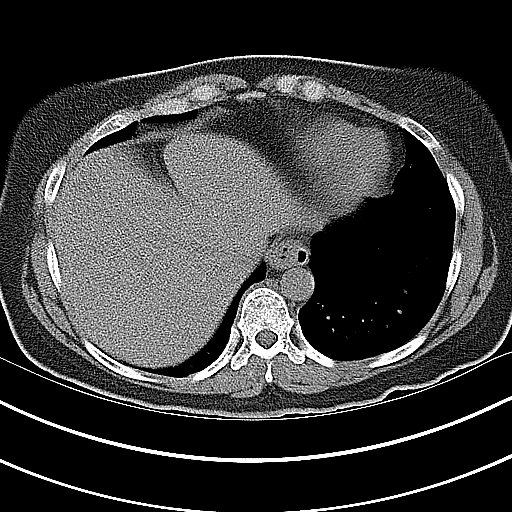
[im 11/16  soft-tissue]
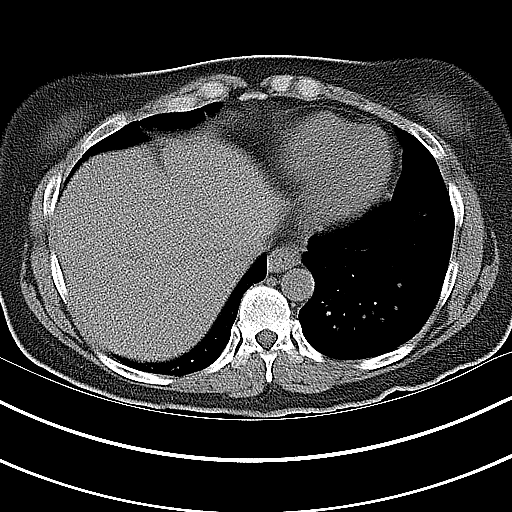
[im 11/16  bone]
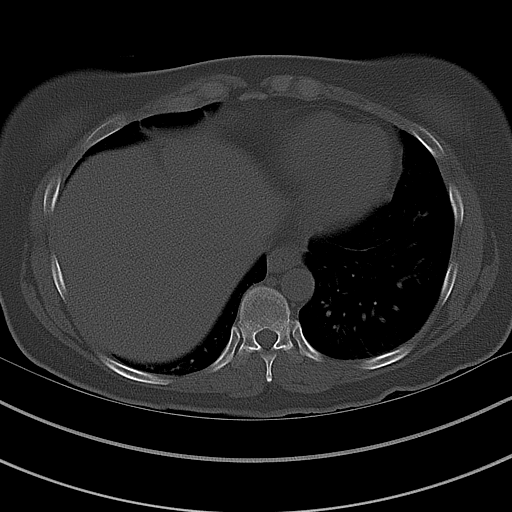
[im 13/16  soft-tissue]
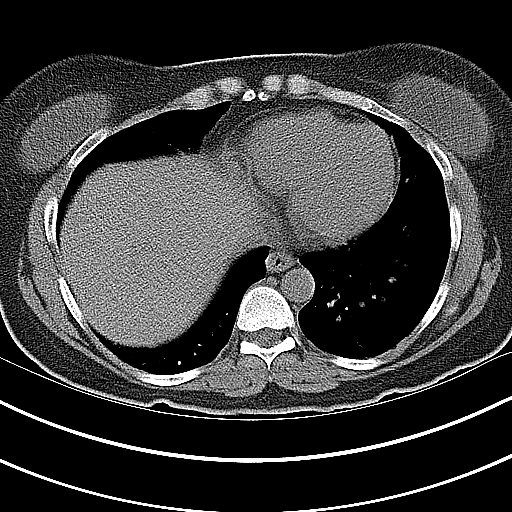
[im 14/16  soft-tissue]
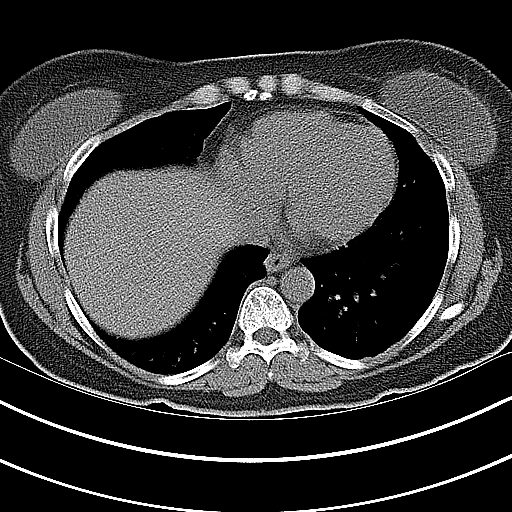
[im 15/16  soft-tissue]
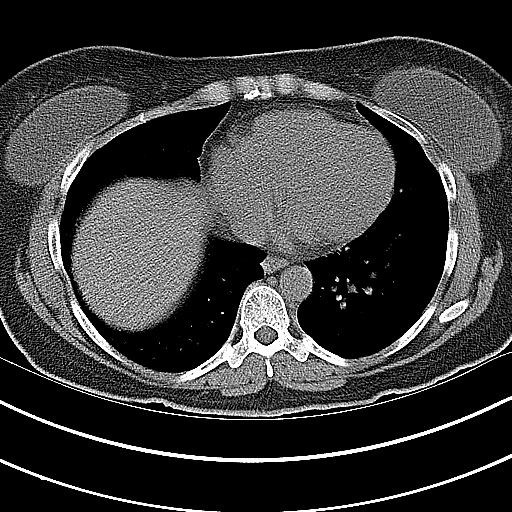

[Series 5: coronal · coronal · 0.74mm/px · 3 of 71 slices shown]
[im 24/71  soft-tissue]
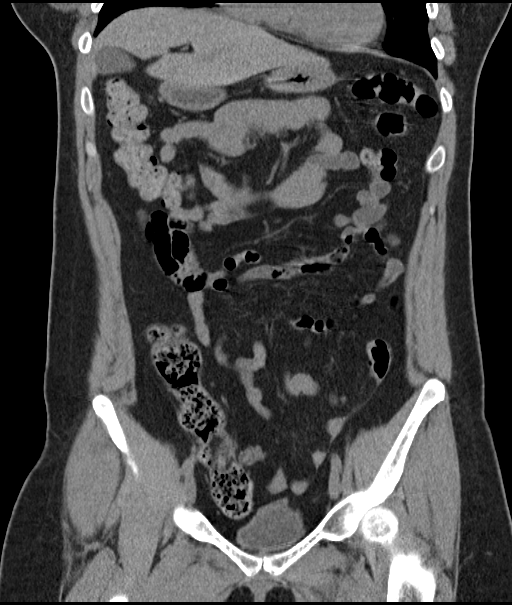
[im 32/71  soft-tissue]
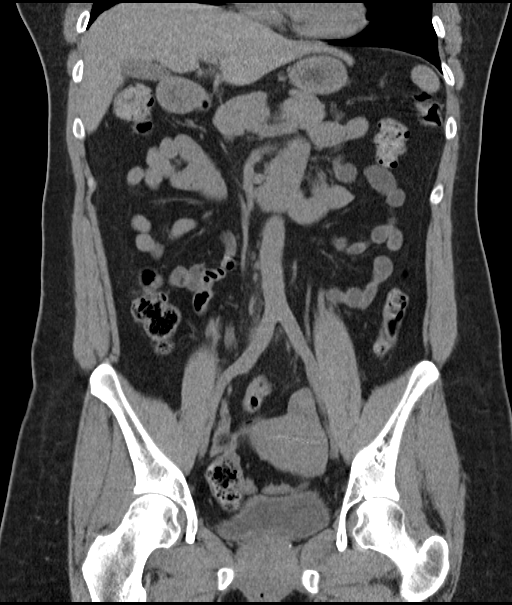
[im 39/71  soft-tissue]
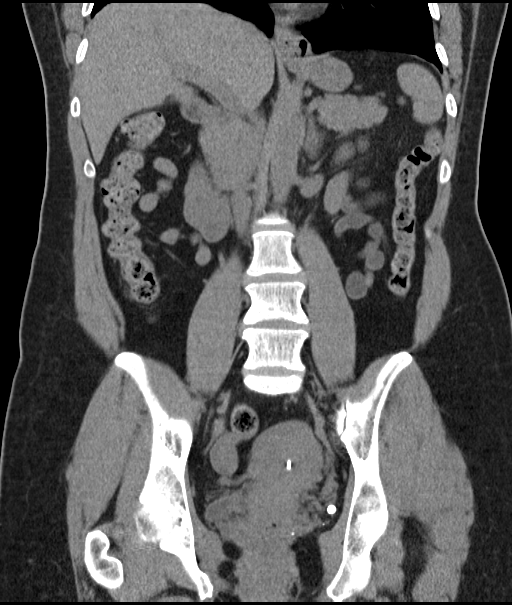

[15 of 30 positions shown; findings below may reference images not displayed]

FINDINGS: Visualized lung bases are clear. There are breast implants
bilaterally.

No focal liver lesions are identified on this noncontrast enhanced
study. Gallbladder wall is not appreciably thickened. There is no
biliary duct dilatation.

Spleen, pancreas, and adrenals appear normal.

In the left kidney, there are two, 1 mm calculi in the upper pole
region. There is an adjacent 2 mm calculus in this area. There are
several 1 mm calculi in the mid left kidney. There is a calculus in
the mid to lower pole region on the left measuring 1.4 x 1.0 cm.
There is no left renal mass or hydronephrosis. There is no
demonstrable ureteral calculus on the left.

On the right, there is mild hydronephrosis. There is no right renal
mass. There is a calculus in the upper pole of the right kidney
measuring 8 x 6 mm. There are several 1 mm calculi in the mid right
kidney. There is a 6 x 5 mm calculus with an adjacent 4 x 3 mm
calculus in the lower pole region. There is a rather subtle a more
fist calculus in the lower pole of the right kidney measuring 9 x 6
mm. There is a 1.3 x 1.0 cm cyst in the lower pole of the right
kidney. There is no demonstrable right ureteral calculus.

In the pelvis, urinary bladder is midline with normal wall
thickness. There is an intrauterine device within the endometrial
region. There is no pelvic mass or pelvic fluid collection. There
are multiple pelvic phleboliths which appears stable compared to the
prior study. The appendix is not appreciable. There is no
periappendiceal region inflammation, however.

There is no bowel obstruction. No free air or portal venous air.
There is a minimal ventral hernia containing only fat. There is no
appreciable ascites, adenopathy, or abscess in the abdomen or
pelvis. There is no abdominal aortic aneurysm. There are no blastic
or lytic bone lesions.
IMPRESSION: Mild hydronephrosis on the right. No obstructing calculus seen.
Question recent calculus passage. It should be noted that
pyelonephritis on the right could present in this manner and is a
differential consideration. There is no evidence suggesting renal
abscess or perinephric inflammation.

There are nonobstructing calculi in each kidney. There may be a
degree of medullary nephrocalcinosis as well given the somewhat
amorphous nature of several calcifications.

Appendix not seen. No periappendiceal region inflammation. No
abscess. No bowel obstruction.

Urinary bladder wall normal in thickness. Intrauterine device within
endometrium.

## 2017-06-19 LAB — HM COLONOSCOPY

## 2019-03-28 DIAGNOSIS — C649 Malignant neoplasm of unspecified kidney, except renal pelvis: Secondary | ICD-10-CM | POA: Insufficient documentation

## 2021-02-18 ENCOUNTER — Emergency Department (INDEPENDENT_AMBULATORY_CARE_PROVIDER_SITE_OTHER)
Admission: EM | Admit: 2021-02-18 | Discharge: 2021-02-18 | Disposition: A | Discharge status: Home / Self Care | Payer: No Typology Code available for payment source | Source: Home / Self Care | Attending: Family Medicine | Admitting: Family Medicine

## 2021-02-18 ENCOUNTER — Other Ambulatory Visit: Payer: Self-pay

## 2021-02-18 DIAGNOSIS — J0100 Acute maxillary sinusitis, unspecified: Secondary | ICD-10-CM

## 2021-02-18 DIAGNOSIS — J069 Acute upper respiratory infection, unspecified: Secondary | ICD-10-CM | POA: Diagnosis not present

## 2021-02-18 DIAGNOSIS — R030 Elevated blood-pressure reading, without diagnosis of hypertension: Secondary | ICD-10-CM

## 2021-02-18 MED ORDER — AMOXICILLIN-POT CLAVULANATE 875-125 MG PO TABS: 1.0000 | ORAL_TABLET | Freq: Two times a day (BID) | ORAL | 0 refills | Status: DC

## 2021-02-18 MED ORDER — FLUTICASONE PROPIONATE 50 MCG/ACT NA SUSP: 2.0000 | Freq: Every day | NASAL | 0 refills | Status: DC

## 2021-02-18 NOTE — ED Triage Notes (Addendum)
Pt c/o cold sxs x 3 weeks inlcuding productive cough. Feverish and chills two nights ago. Had sore throat which has somewhat resolved. Chest congestion and headache today. Mucinex DM, Xycam prn. No known covid exposure.

## 2021-02-18 NOTE — ED Provider Notes (Signed)
[facility]    CSN: [id number] Arrival date & time: 02/18/21  1111      History   Chief Complaint Chief Complaint  Patient presents with  . Cough  . Nasal Congestion  . Sore Throat    HPI [name] is a 50 y.o. female.   HPI   Patient has had respiratory symptoms for 3 weeks.  She has runny nose, stuffy nose, hoarse voice, postnasal drip, cough.  She had fever initially but this is gotten better.  No headache or body aches.  No change in taste or smell.  She has had some sensation of shortness of breath.  Trouble breathing through her nose that keeps her awake at night.  Has tried multiple over-the-counter medications. Patient takes chlorthalidone for kidney stones.  She has been out of it for a couple of months.  She states she is never taken it for hypertension.  Her blood pressure is elevated today.  She is reminded to follow-up with her primary care doctor.  Past Medical History:  Diagnosis Date  . Abdominal pain, generalized   . Chronic constipation   . Esophageal reflux   . History of kidney stones   . Left ureteral calculus   . Microscopic hematuria   . Personal history of urinary calculi   . Renal stones     Patient Active Problem List   Diagnosis Date Noted  . Nephrolithiasis 04/24/2012  . CONSTIPATION 06/25/2008  . MALAISE AND FATIGUE 06/25/2008    Past Surgical History:  Procedure Laterality Date  . BREAST ENHANCEMENT SURGERY    . CYSTO/ BILATERAL URETERAL STENT PLACEMENT  08-05-2011  . lithothripsy    . PERCUTANEOUS NEPHROSTOLITHOTOMY  09-03-2011   RIGHT  . PERCUTANOUS NEPHROSTOLITHOTOMY  10-08-2011   LEFT  . RENAL ENDOSCOPY VIA NEPHROSTOMY / PYELOSTOMY    . REPEAT CESAREAN SECTION  04-13-2002   W/ TUBAL LIGATION  . RIGHT URETEROSCOPIC STONE EXTRACTION  11-26-2005    OB History   No obstetric history on file.      Home Medications    Prior to Admission medications   Medication Sig Start Date End Date Taking? Authorizing  Provider  amoxicillin-clavulanate (AUGMENTIN) 875-125 MG tablet Take 1 tablet by mouth every 12 (twelve) hours. 02/18/21  Yes Raylene Everts, MD  fluticasone [facility]) 50 MCG/ACT nasal spray Place 2 sprays into both nostrils daily. 02/18/21  Yes Raylene Everts, MD  acetaminophen (TYLENOL) 500 MG tablet Take 500 mg by mouth every 6 (six) hours as needed (pain).    [provider]  chlorthalidone (HYGROTON) 25 MG tablet Take 25 mg by mouth daily.    [provider]  HYDROcodone-acetaminophen (NORCO) 5-325 MG per tablet Take 1-2 tablets by mouth every 6 (six) hours as needed for moderate pain. 06/05/15   Hyman Bible, PA-C  naproxen (NAPROSYN) 250 MG tablet Take 250 mg by mouth 2 (two) times daily between meals as needed (pain).    [provider]  naproxen sodium (ANAPROX) 220 MG tablet Take 440 mg by mouth 2 (two) times daily as needed (pain).    [provider]    Family History Family History  Problem Relation Age of Onset  . Cancer Maternal Grandfather        lung    Social History Social History   Tobacco Use  . Smoking status: Never Smoker  Substance Use Topics  . Alcohol use: Yes    Comment: 1-2 times a month  . Drug use: No  Allergies   Patient has no known allergies.   Review of Systems Review of Systems See HPI  Physical Exam Triage Vital Signs ED Triage Vitals  Enc Vitals Group     BP 02/18/21 1135 (!) 164/109     Pulse Rate 02/18/21 1135 93     Resp 02/18/21 1135 18     Temp 02/18/21 1135 98.1 F (36.7 C)     Temp Source 02/18/21 1135 Oral     SpO2 02/18/21 1135 98 %     Weight --      Height --      Head Circumference --      Peak Flow --      Pain Score 02/18/21 1137 0     Pain Loc --      Pain Edu? --      Excl. in [place]? --    No data found.  Updated Vital Signs BP (!) 164/109 (BP Location: Right Arm)   Pulse 93   Temp 98.1 F (36.7 C) (Oral)   Resp 18   SpO2 98%   Physical  Exam Constitutional:      General: She is not in acute distress.    Appearance: She is well-developed, normal weight and well-nourished.     Comments: Mask is in place.  No acute distress  HENT:     Head: Normocephalic and atraumatic.     Right Ear: Tympanic membrane and ear canal normal.     Left Ear: Tympanic membrane and ear canal normal.     Nose: Congestion and rhinorrhea present.     Comments: Nasal membranes are swollen    Mouth/Throat:     Mouth: Mucous membranes are moist.     Pharynx: Posterior oropharyngeal erythema present.     Tonsils: No tonsillar exudate or tonsillar abscesses.  Eyes:     Conjunctiva/sclera: Conjunctivae normal.     Pupils: Pupils are equal, round, and reactive to light.  Cardiovascular:     Rate and Rhythm: Normal rate. Rhythm irregular.  Pulmonary:     Effort: Pulmonary effort is normal. No respiratory distress.     Breath sounds: Rhonchi present. No wheezing or rales.     Comments: Few central rhonchi.  No rales Abdominal:     General: There is no distension.     Palpations: Abdomen is soft.  Musculoskeletal:        General: No edema. Normal range of motion.     Cervical back: Normal range of motion.  Lymphadenopathy:     Cervical: Cervical adenopathy present.  Skin:    General: Skin is warm and dry.  Neurological:     Mental Status: She is alert.      UC Treatments / Results  Labs (all labs ordered are listed, but only abnormal results are displayed) Labs Reviewed - No data to display  EKG   Radiology No results found.  Procedures Procedures (including critical care time)  Medications Ordered in UC Medications - No data to display  Initial Impression / Assessment and Plan / UC Course  I have reviewed the triage vital signs and the nursing notes.  Pertinent labs & imaging results that were available during my care of the patient were reviewed by me and considered in my medical decision making (see chart for details).      Since patient has had 3 weeks of symptoms will cover her with an antibiotic.  Follow-up with primary care for blood pressure Final Clinical Impressions(s) / UC  Diagnoses   Final diagnoses:  Viral URI with cough  Acute non-recurrent maxillary sinusitis  Elevated blood-pressure reading without diagnosis of hypertension     Discharge Instructions     Drink plenty of fluids Use the Flonase nasal spray as directed until your symptoms improve Take antibiotic 2 times a day for 7 days A humidifier or vaporizer may help Your blood pressure is elevated today.  You need to recheck this with your primary care doctor Return as needed   ED Prescriptions    Medication Sig Dispense Auth. Provider   amoxicillin-clavulanate (AUGMENTIN) 875-125 MG tablet Take 1 tablet by mouth every 12 (twelve) hours. 14 tablet Raylene Everts, MD   fluticasone [facility]) 50 MCG/ACT nasal spray Place 2 sprays into both nostrils daily. 16 g Raylene Everts, MD     PDMP not reviewed this encounter.   Raylene Everts, MD 02/18/21 [phone]

## 2021-02-18 NOTE — Discharge Instructions (Signed)
Drink plenty of fluids Use the Flonase nasal spray as directed until your symptoms improve Take antibiotic 2 times a day for 7 days A humidifier or vaporizer may help Your blood pressure is elevated today.  You need to recheck this with your primary care doctor Return as needed

## 2024-11-13 ENCOUNTER — Ambulatory Visit: Payer: Self-pay

## 2024-11-13 NOTE — Telephone Encounter (Signed)
 FYI Only or Action Required?: FYI only for provider: ED advised. As pt is a new pt, new pt appt scheduled 12/5.  Called Nurse Triage reporting Hypertension.  Symptoms began today.  Interventions attempted: Nothing.  Symptoms are: unchanged.  Triage Disposition: See HCP Within 4 Hours (Or PCP Triage)  Patient/caregiver understands and will follow disposition?: Yes, will follow disposition  Copied from CRM #[id number]. Topic: Clinical - Red Word Triage >> Nov 13, 2024  2:57 PM Mia F wrote: Red Word that prompted transfer to Nurse Triage: Pt called to make a np appt due to having a bp reading of 210/120 after 3 readings. She says that she doesn't have any symptoms except for fogginess. She says she always had kidney issues that could have something to do with the high readings. Reason for Disposition  [1] Systolic BP >= 200 OR Diastolic >= 120 AND [2] having NO cardiac or neurologic symptoms  Answer Assessment - Initial Assessment Questions 1. BLOOD PRESSURE: What is your blood pressure? Did you take at least two measurements 5 minutes apart?     210/120, around the same thing-2nd reading 2. ONSET: When did you take your blood pressure?     Was at OB/GYN and found to be hypertensive 3. HOW: How did you take your blood pressure? (e.g., automatic home BP monitor, visiting nurse)     auto 4. HISTORY: Do you have a history of high blood pressure?     Pt denies hx, states that she knew it was elevated here and there 5. MEDICINES: Are you taking any medicines for blood pressure? Have you missed any doses recently?     denies 6. OTHER SYMPTOMS: Do you have any symptoms? (e.g., blurred vision, chest pain, difficulty breathing, headache, weakness)     Denies, states feels completely fine 7. PREGNANCY: Is there any chance you are pregnant? When was your last menstrual period?     Denies  Protocols used: Blood Pressure - High-A-AH

## 2024-11-23 ENCOUNTER — Ambulatory Visit: Admitting: Sports Medicine

## 2024-12-17 ENCOUNTER — Encounter: Payer: Self-pay | Admitting: Family Medicine

## 2024-12-17 ENCOUNTER — Ambulatory Visit: Payer: Self-pay | Admitting: Family Medicine

## 2024-12-17 VITALS — BP 137/90 | HR 85 | Temp 98.2°F | Ht 64.0 in | Wt 171.0 lb

## 2024-12-17 DIAGNOSIS — I10 Essential (primary) hypertension: Secondary | ICD-10-CM | POA: Diagnosis not present

## 2024-12-17 DIAGNOSIS — E559 Vitamin D deficiency, unspecified: Secondary | ICD-10-CM | POA: Diagnosis not present

## 2024-12-17 DIAGNOSIS — Z8632 Personal history of gestational diabetes: Secondary | ICD-10-CM

## 2024-12-17 DIAGNOSIS — Z0000 Encounter for general adult medical examination without abnormal findings: Secondary | ICD-10-CM | POA: Diagnosis not present

## 2024-12-17 LAB — CBC WITH DIFFERENTIAL/PLATELET
Basophils Absolute: 0 K/uL (ref 0.0–0.1)
Basophils Relative: 0.3 % (ref 0.0–3.0)
Eosinophils Absolute: 0.1 K/uL (ref 0.0–0.7)
Eosinophils Relative: 1.3 % (ref 0.0–5.0)
HCT: 42.4 % (ref 36.0–46.0)
Hemoglobin: 14.2 g/dL (ref 12.0–15.0)
Lymphocytes Relative: 29.5 % (ref 12.0–46.0)
Lymphs Abs: 1.2 K/uL (ref 0.7–4.0)
MCHC: 33.4 g/dL (ref 30.0–36.0)
MCV: 91.5 fl (ref 78.0–100.0)
Monocytes Absolute: 0.4 K/uL (ref 0.1–1.0)
Monocytes Relative: 11.2 % (ref 3.0–12.0)
Neutro Abs: 2.3 K/uL (ref 1.4–7.7)
Neutrophils Relative %: 57.7 % (ref 43.0–77.0)
Platelets: 245 K/uL (ref 150.0–400.0)
RBC: 4.64 Mil/uL (ref 3.87–5.11)
RDW: 13.3 % (ref 11.5–15.5)
WBC: 3.9 K/uL — ABNORMAL LOW (ref 4.0–10.5)

## 2024-12-17 LAB — COMPREHENSIVE METABOLIC PANEL WITH GFR
ALT: 87 U/L — ABNORMAL HIGH (ref 3–35)
AST: 84 U/L — ABNORMAL HIGH (ref 5–37)
Albumin: 4.5 g/dL (ref 3.5–5.2)
Alkaline Phosphatase: 79 U/L (ref 39–117)
BUN: 12 mg/dL (ref 6–23)
CO2: 29 meq/L (ref 19–32)
Calcium: 8.5 mg/dL (ref 8.4–10.5)
Chloride: 102 meq/L (ref 96–112)
Creatinine, Ser: 0.83 mg/dL (ref 0.40–1.20)
GFR: 81.2 mL/min
Glucose, Bld: 95 mg/dL (ref 70–99)
Potassium: 4 meq/L (ref 3.5–5.1)
Sodium: 139 meq/L (ref 135–145)
Total Bilirubin: 0.7 mg/dL (ref 0.2–1.2)
Total Protein: 7.4 g/dL (ref 6.0–8.3)

## 2024-12-17 LAB — TSH: TSH: 1.11 u[IU]/mL (ref 0.35–5.50)

## 2024-12-17 LAB — VITAMIN D 25 HYDROXY (VIT D DEFICIENCY, FRACTURES): VITD: 22.19 ng/mL — ABNORMAL LOW (ref 30.00–100.00)

## 2024-12-17 LAB — LIPID PANEL
Cholesterol: 166 mg/dL (ref 28–200)
HDL: 35.5 mg/dL — ABNORMAL LOW
LDL Cholesterol: 91 mg/dL (ref 10–99)
NonHDL: 130.72
Total CHOL/HDL Ratio: 5
Triglycerides: 201 mg/dL — ABNORMAL HIGH (ref 10.0–149.0)
VLDL: 40.2 mg/dL — ABNORMAL HIGH (ref 0.0–40.0)

## 2024-12-17 LAB — HEMOGLOBIN A1C: Hgb A1c MFr Bld: 5.6 % (ref 4.6–6.5)

## 2024-12-17 MED ORDER — AMLODIPINE BESY-BENAZEPRIL HCL 5-10 MG PO CAPS: 1.0000 | ORAL_CAPSULE | Freq: Every day | ORAL | 0 refills | Status: DC

## 2024-12-17 NOTE — Progress Notes (Signed)
 "     Office Note 12/17/2024  CC:  Chief Complaint  Patient presents with   Establish Care    Pt reports last CRC 2018; last pap (GYN) 10/2024, last mammogram 10/2023   Congestion    Nasal and chest    HPI:  [name] is a 53 y.o. female who is here to establish care, discuss elevated blood pressure. Patient's most recent primary MD: None. Old records were not available for review prior to or during today's visit.  She was noted to have blood pressure of 200/100 at a relatively recent GYN follow-up visit. She started checking her blood pressure at home after that and has been in the 170/100 range. She is not on any antihypertensive medication but she recalls being on some in the remote past (chlorthalidone). She recalls feeling bad on the chlorthalidone but she cannot be sure that her symptoms were not coming from significantly elevated blood pressure rather than the medication.  She describes feeling generalized head fogginess and a nondescript dizziness feeling over the last 1 year. No vertigo, no headache, no visual abnormalities, no nausea or vomiting, no chest pain or shortness of breath, no lower extremity swelling.  She has remote history of problems with kidney stones, required multiple extraction procedures.  Recent URI with cough.  Clearing up.  Past Medical History:  Diagnosis Date   GERD (gastroesophageal reflux disease)    History of gestational diabetes    Hyperparathyroidism    2013-->SPECT neg for adenoma, MRI neck NEG   Hypertension    Personal history of urinary calculi    hx bilat nephrolithotomy    Past Surgical History:  Procedure Laterality Date   BREAST ENHANCEMENT SURGERY  2003   COLONOSCOPY  2009   CYSTO/ BILATERAL URETERAL STENT PLACEMENT  08/05/2011   lithothripsy     PERCUTANEOUS NEPHROSTOLITHOTOMY  09/03/2011   RIGHT   PERCUTANOUS NEPHROSTOLITHOTOMY  10/08/2011   LEFT   RENAL ENDOSCOPY VIA NEPHROSTOMY / PYELOSTOMY     REPEAT CESAREAN  SECTION  04/13/2002   W/ TUBAL LIGATION   RIGHT URETEROSCOPIC STONE EXTRACTION  11/26/2005    Family History  Problem Relation Age of Onset   COPD Mother    Depression Mother    Alcohol abuse Father    Hypertension Father    Cancer Maternal Grandfather        lung    Social History   Socioeconomic History   Marital status: Single    Spouse name: Not on file   Number of children: Not on file   Years of education: Not on file   Highest education level: Not on file  Occupational History   Not on file  Tobacco Use   Smoking status: Never   Smokeless tobacco: Not on file  Substance and Sexual Activity   Alcohol use: Yes    Comment: 1-2 times a month   Drug use: No   Sexual activity: Yes    Birth control/protection: Surgical    Comment: tubal ligation 2003  Other Topics Concern   Not on file  Social History Narrative   Divorced,   Educ: Associates degree   Occup: Field Liaison   No smoking   Social Drivers of Health   Tobacco Use: Unknown (12/17/2024)   Patient History    Smoking Tobacco Use: Never    Smokeless Tobacco Use: Unknown    Passive Exposure: Not on file  Financial Resource Strain: Not on file  Food Insecurity: Not on file  Transportation  Needs: Not on file  Physical Activity: Not on file  Stress: Not on file  Social Connections: Unknown (07/16/2022)   [facility] from [facility]   Social Network    Social Network: Not on file  Intimate Partner Violence: Unknown (07/16/2022)   Received from [facility]   HITS    Physically Hurt: Not on file    Insult or Talk Down To: Not on file    Threaten Physical Harm: Not on file    Scream or Curse: Not on file  Depression (PHQ2-9): Medium Risk (12/17/2024)   Depression (PHQ2-9)    PHQ-2 Score: 8  Alcohol Screen: Not on file  Housing: Not on file  Utilities: Not on file  Health Literacy: Not on file    Outpatient Encounter Medications as of 12/17/2024  Medication Sig   amLODipine -benazepril  (LOTREL)  5-10 MG capsule Take 1 capsule by mouth daily.   esomeprazole (NEXIUM) 20 MG packet Take 20 mg by mouth daily before breakfast.   estradiol (ESTRACE) 0.01 % CREA vaginal cream Place 1 Applicatorful vaginally.   levonorgestrel (MIRENA, 52 MG,) 20 MCG/DAY IUD Mirena 20 mcg/24 hours (5 yrs) 52 mg intrauterine device Take 1 device by intrauterine route.   Semaglutide,0.25 or 0.5MG /DOS, 2 MG/1.5ML SOPN INJECT (10 UNITS ON INSULIN SYRINGE) UNDER THE SKIN ONCE WEEKLY. DO NOT GO UP WITHOUT SPEAKING TO THE PROVIDER. (Patient not taking: Reported on 12/17/2024)   [DISCONTINUED] acetaminophen  (TYLENOL ) 500 MG tablet Take 500 mg by mouth every 6 (six) hours as needed (pain).   [DISCONTINUED] amoxicillin -clavulanate (AUGMENTIN ) 875-125 MG tablet Take 1 tablet by mouth every 12 (twelve) hours.   [DISCONTINUED] chlorthalidone (HYGROTON) 25 MG tablet Take 25 mg by mouth daily.   [DISCONTINUED] escitalopram (LEXAPRO) 10 MG tablet TK 1 T PO QD   [DISCONTINUED] fluticasone  (FLONASE ) 50 MCG/ACT nasal spray Place 2 sprays into both nostrils daily.   [DISCONTINUED] HYDROcodone -acetaminophen  (NORCO) 5-325 MG per tablet Take 1-2 tablets by mouth every 6 (six) hours as needed for moderate pain.   [DISCONTINUED] naproxen (NAPROSYN) 250 MG tablet Take 250 mg by mouth 2 (two) times daily between meals as needed (pain).   [DISCONTINUED] naproxen sodium (ANAPROX) 220 MG tablet Take 440 mg by mouth 2 (two) times daily as needed (pain).   [DISCONTINUED] tamsulosin (FLOMAX) 0.4 MG CAPS capsule Take 0.4 mg by mouth.   No facility-administered encounter medications on file as of 12/17/2024.    Allergies[1]  Review of Systems  Constitutional:  Negative for appetite change, chills, fatigue and fever.  HENT:  Negative for congestion, dental problem, ear pain and sore throat.   Eyes:  Negative for discharge, redness and visual disturbance.  Respiratory:  Negative for cough, chest tightness, shortness of breath and wheezing.    Cardiovascular:  Negative for chest pain, palpitations and leg swelling.  Gastrointestinal:  Negative for abdominal pain, blood in stool, diarrhea, nausea and vomiting.  Genitourinary:  Negative for difficulty urinating, dysuria, flank pain, frequency, hematuria and urgency.  Musculoskeletal:  Negative for arthralgias, back pain, joint swelling, myalgias and neck stiffness.  Skin:  Negative for pallor and rash.  Neurological:  Negative for dizziness, speech difficulty, weakness and headaches.  Hematological:  Negative for adenopathy. Does not bruise/bleed easily.  Psychiatric/Behavioral:  Negative for confusion and sleep disturbance. The patient is not nervous/anxious.    PE; Blood pressure (!) 137/90, pulse 85, temperature 98.2 F (36.8 C), temperature source Oral, height 5' 4 (1.626 m), weight 171 lb (77.6 kg), SpO2 97%. Body mass index is  29.35 kg/m.  Physical Exam  Gen: Alert, well appearing.  Patient is oriented to person, place, time, and situation. AFFECT: pleasant, lucid thought and speech. ENT: Ears: EACs clear, normal epithelium.  TMs with good light reflex and landmarks bilaterally.  Eyes: no injection, icteris, swelling, or exudate.  EOMI, PERRLA. Nose: no drainage or turbinate edema/swelling.  No injection or focal lesion.  Mouth: lips without lesion/swelling.  Oral mucosa pink and moist.  Dentition intact and without obvious caries or gingival swelling.  Oropharynx without erythema, exudate, or swelling.  Neck: supple/nontender.  No LAD, mass, or TM.  Carotid pulses 2+ bilaterally, without bruits. CV: RRR, no m/r/g.   LUNGS: CTA bilat, nonlabored resps, good aeration in all lung fields. ABD: soft, NT, ND. EXT: no clubbing, cyanosis, or edema.  Musculoskeletal: no joint swelling, erythema, warmth, or tenderness.  ROM of all joints intact. Skin - no sores or suspicious lesions or rashes or color changes  Pertinent labs:  Last CBC Lab Results  Component Value Date   WBC  4.8 06/05/2015   HGB 14.4 06/05/2015   HCT 44.5 06/05/2015   MCV 95.1 06/05/2015   MCH 30.8 06/05/2015   RDW 12.8 06/05/2015   PLT 293 06/05/2015   Last metabolic panel Lab Results  Component Value Date   GLUCOSE 96 06/05/2015   NA 137 06/05/2015   K 3.8 06/05/2015   CL 101 06/05/2015   CO2 28 06/05/2015   BUN 17 06/05/2015   CREATININE 0.87 06/05/2015   GFRNONAA >60 06/05/2015   CALCIUM 9.1 06/05/2015   PROT 7.8 06/05/2015   ALBUMIN 4.4 06/05/2015   BILITOT 1.0 06/05/2015   ALKPHOS 51 06/05/2015   AST 17 06/05/2015   ALT 18 06/05/2015   ANIONGAP 8 06/05/2015   Last thyroid functions Lab Results  Component Value Date   TSH 1.29 06/25/2008   T4TOTAL 9.4 06/25/2008   Last vitamin D  Lab Results  Component Value Date   VD25OH 54 04/24/2012   ASSESSMENT AND PLAN:   New patient, establishing care.  #1 health maintenance exam: Reviewed age and gender appropriate health maintenance issues (prudent diet, regular exercise, health risks of tobacco and excessive alcohol, use of seatbelts, fire alarms in home, use of sunscreen).  Also reviewed age and gender appropriate health screening as well as vaccine recommendations. Vaccines: declined flu and prevnar and shingrix. Labs: HP, vit D, and a1c Cervical ca screening: pap UTD Nov 2025 with GYN MD Breast ca screening: last was 2024, GYN MD office. Colon ca screening: last colonoscopy 2009-->she defers further screening for now.  #2 uncontrolled hypertension. Will avoid the chlorthalidone for now, although it is not clear that she actually had adverse side effects from this medication. Start amlodipine -benazepril  5-10, 1 tab daily. Therapeutic expectations and side effect profile of medication discussed today.  Patient's questions answered. Monitor blood pressure at home and we will recheck in the office in 1 week. Metabolic panel today.  3.  History of vitamin D  deficiency. She is not currently on supplement. Check  vitamin D  level today.  4.  History of gestational diabetes. Fasting glucose and hemoglobin A1c today.  An After Visit Summary was printed and given to the patient.  Return in about 1 week (around 12/24/2024) for f/u HTN.  Signed:  Gerlene Hockey, MD           12/17/2024     [1] No Known Allergies  "

## 2024-12-27 ENCOUNTER — Encounter: Payer: Self-pay | Admitting: Family Medicine

## 2024-12-27 ENCOUNTER — Ambulatory Visit (INDEPENDENT_AMBULATORY_CARE_PROVIDER_SITE_OTHER): Admitting: Family Medicine

## 2024-12-27 VITALS — BP 136/86 | HR 100 | Temp 97.2°F | Ht 64.0 in | Wt 172.6 lb

## 2024-12-27 DIAGNOSIS — E559 Vitamin D deficiency, unspecified: Secondary | ICD-10-CM

## 2024-12-27 DIAGNOSIS — I10 Essential (primary) hypertension: Secondary | ICD-10-CM | POA: Diagnosis not present

## 2024-12-27 DIAGNOSIS — R7401 Elevation of levels of liver transaminase levels: Secondary | ICD-10-CM | POA: Diagnosis not present

## 2024-12-27 DIAGNOSIS — Z7689 Persons encountering health services in other specified circumstances: Secondary | ICD-10-CM

## 2024-12-27 LAB — COMPREHENSIVE METABOLIC PANEL WITH GFR
ALT: 57 U/L — ABNORMAL HIGH (ref 3–35)
AST: 32 U/L (ref 5–37)
Albumin: 4.2 g/dL (ref 3.5–5.2)
Alkaline Phosphatase: 85 U/L (ref 39–117)
BUN: 20 mg/dL (ref 6–23)
CO2: 26 meq/L (ref 19–32)
Calcium: 8.9 mg/dL (ref 8.4–10.5)
Chloride: 103 meq/L (ref 96–112)
Creatinine, Ser: 0.94 mg/dL (ref 0.40–1.20)
GFR: 69.92 mL/min
Glucose, Bld: 141 mg/dL — ABNORMAL HIGH (ref 70–99)
Potassium: 4.2 meq/L (ref 3.5–5.1)
Sodium: 137 meq/L (ref 135–145)
Total Bilirubin: 0.9 mg/dL (ref 0.2–1.2)
Total Protein: 7.1 g/dL (ref 6.0–8.3)

## 2024-12-27 MED ORDER — AMLODIPINE BESY-BENAZEPRIL HCL 5-10 MG PO CAPS: ORAL_CAPSULE | ORAL | 0 refills | Status: DC

## 2024-12-27 NOTE — Progress Notes (Signed)
 OFFICE VISIT  12/27/2024  CC:  Chief Complaint  Patient presents with   Medical Management of Chronic Issues    Hypertension follow up    Patient is a 54 y.o. female who presents for 10-day follow-up uncontrolled hypertension and elevated transaminases. A/P as of last visit: 1 uncontrolled hypertension. Will avoid the chlorthalidone for now, although it is not clear that she actually had adverse side effects from this medication. Start amlodipine -benazepril  5-10, 1 tab daily. Therapeutic expectations and side effect profile of medication discussed today.  Patient's questions answered. Monitor blood pressure at home and we will recheck in the office in 1 week. Metabolic panel today.   2.  History of vitamin D  deficiency. She is not currently on supplement. Check vitamin D  level today.   3.  History of gestational diabetes. Fasting glucose and hemoglobin A1c today.  INTERIM HX: [name] is feeling much better. Blood pressures have come down but are not quite into the goal range of 130/80 or less.  In fact, she is still had some measurements into the 160-200 systolic range.  Lowest blood pressure 120s systolic.  We discussed her labs from last visit, mild elevation of AST and ALT.  She drinks wine occasionally.  She had been taking a significant amount of Tylenol  around the time of her labs because she had been ill recently.  She no longer does this.  Review of systems: No headaches, dizziness, palpitations, chest pain, shortness of breath, lower extremity swelling, abdominal pain, nausea, vomiting, or weight loss.   Past Medical History:  Diagnosis Date   Elevated transaminase level    11/2024   GERD (gastroesophageal reflux disease)    History of gestational diabetes    Hyperparathyroidism    question of.  Mild PTH elev 2013 (but Ca++ normal)-->SPECT neg for adenoma, MRI neck NEG   Hypertension    Personal history of urinary calculi    hx bilat nephrolithotomy   Vitamin D   deficiency     Past Surgical History:  Procedure Laterality Date   BREAST ENHANCEMENT SURGERY  2003   COLONOSCOPY  2009   CYSTO/ BILATERAL URETERAL STENT PLACEMENT  08/05/2011   lithothripsy     PERCUTANEOUS NEPHROSTOLITHOTOMY  09/03/2011   RIGHT   PERCUTANOUS NEPHROSTOLITHOTOMY  10/08/2011   LEFT   RENAL ENDOSCOPY VIA NEPHROSTOMY / PYELOSTOMY     REPEAT CESAREAN SECTION  04/13/2002   W/ TUBAL LIGATION   RIGHT URETEROSCOPIC STONE EXTRACTION  11/26/2005    Outpatient Medications Prior to Visit  Medication Sig Dispense Refill   esomeprazole (NEXIUM) 20 MG packet Take 20 mg by mouth daily before breakfast.     estradiol (ESTRACE) 0.01 % CREA vaginal cream Place 1 Applicatorful vaginally.     levonorgestrel (MIRENA, 52 MG,) 20 MCG/DAY IUD Mirena 20 mcg/24 hours (5 yrs) 52 mg intrauterine device Take 1 device by intrauterine route.     Semaglutide,0.25 or 0.5MG /DOS, 2 MG/1.5ML SOPN INJECT (10 UNITS ON INSULIN SYRINGE) UNDER THE SKIN ONCE WEEKLY. DO NOT GO UP WITHOUT SPEAKING TO THE PROVIDER. (Patient not taking: Reported on 12/27/2024)     amLODipine -benazepril  (LOTREL) 5-10 MG capsule Take 1 capsule by mouth daily. 30 capsule 0   No facility-administered medications prior to visit.    Allergies[1]  Review of Systems As per HPI  PE:    12/27/2024    8:32 AM 12/17/2024    1:17 PM 02/18/2021   11:35 AM  Vitals with BMI  Height 5' 4 5' 4   Weight  172 lbs 10 oz 171 lbs   BMI 29.61 29.34   Systolic 136 137 835  Diastolic 86 90 109  Pulse 100 85 93     Physical Exam  Gen: Alert, well appearing.  Patient is oriented to person, place, time, and situation. AFFECT: pleasant, lucid thought and speech. No further exam today  LABS:  Last CBC Lab Results  Component Value Date   WBC 3.9 (L) 12/17/2024   HGB 14.2 12/17/2024   HCT 42.4 12/17/2024   MCV 91.5 12/17/2024   MCH 30.8 06/05/2015   RDW 13.3 12/17/2024   PLT 245.0 12/17/2024   Last metabolic panel Lab Results   Component Value Date   GLUCOSE 95 12/17/2024   NA 139 12/17/2024   K 4.0 12/17/2024   CL 102 12/17/2024   CO2 29 12/17/2024   BUN 12 12/17/2024   CREATININE 0.83 12/17/2024   GFR 81.20 12/17/2024   CALCIUM 8.5 12/17/2024   PROT 7.4 12/17/2024   ALBUMIN 4.5 12/17/2024   BILITOT 0.7 12/17/2024   ALKPHOS 79 12/17/2024   AST 84 (H) 12/17/2024   ALT 87 (H) 12/17/2024   ANIONGAP 8 06/05/2015   Last lipids Lab Results  Component Value Date   CHOL 166 12/17/2024   HDL 35.50 (L) 12/17/2024   LDLCALC 91 12/17/2024   TRIG 201.0 (H) 12/17/2024   CHOLHDL 5 12/17/2024   Last hemoglobin A1c Lab Results  Component Value Date   HGBA1C 5.6 12/17/2024   Last thyroid functions Lab Results  Component Value Date   TSH 1.11 12/17/2024   T4TOTAL 9.4 06/25/2008   Last vitamin D  Lab Results  Component Value Date   VD25OH 22.19 (L) 12/17/2024   IMPRESSION AND PLAN:  #1 uncontrolled hypertension, improving since getting on amlodipine -benazepril  5-10, 1 Daily.  Will increase to 1 Twice daily now. Metabolic panel today.  2.  Elevated LFTs. Possibly due to excessive Tylenol  intake plus minus some alcohol intake.  Will recheck today and do viral hepatitis serology for completeness. She may have some metabolic dysfunction associated fatty liver (triglycerides were mildly elevated last visit as well).  #3 overweight, BMI 29.  Weight management. She had been on Ozempic 0.5 mg weekly a little bit before she became my patient recently.  She got off of it because she had not been feeling well overall.  Now that she feels better and her blood pressure is better she we will go ahead and get on 0.5 mg of Ozempic weekly again.  #4 vitamin D  deficiency. She will start 1000 unit vitamin D  tab daily. We will recheck vitamin D  level in a few months.  An After Visit Summary was printed and given to the patient.  FOLLOW UP: Return in about 4 weeks (around 01/24/2025) for f/u HTN .  Signed:  Gerlene Hockey, MD           12/27/2024     [1] No Known Allergies

## 2024-12-28 LAB — HEPATITIS C ANTIBODY: Hepatitis C Ab: NONREACTIVE

## 2024-12-28 LAB — HEPATITIS B CORE ANTIBODY, IGM: Hep B C IgM: NONREACTIVE

## 2024-12-28 LAB — HEPATITIS B SURFACE ANTIGEN: Hepatitis B Surface Ag: NONREACTIVE

## 2024-12-30 ENCOUNTER — Encounter: Payer: Self-pay | Admitting: Family Medicine

## 2024-12-30 ENCOUNTER — Ambulatory Visit: Payer: Self-pay | Admitting: Family Medicine

## 2024-12-31 NOTE — Telephone Encounter (Signed)
 No further action needed at this time.

## 2025-01-08 ENCOUNTER — Telehealth: Payer: Self-pay

## 2025-01-08 NOTE — Telephone Encounter (Signed)
 MyChart message reminder sent regarding mammogram.

## 2025-01-09 DIAGNOSIS — R7401 Elevation of levels of liver transaminase levels: Secondary | ICD-10-CM | POA: Insufficient documentation

## 2025-01-09 DIAGNOSIS — K2980 Duodenitis without bleeding: Secondary | ICD-10-CM | POA: Insufficient documentation

## 2025-01-10 LAB — LIPID PANEL
Cholesterol: 184 (ref 0–200)
HDL: 53 (ref 35–70)
LDL Cholesterol: 112
LDl/HDL Ratio: 3
Triglycerides: 93 (ref 40–160)

## 2025-01-11 DIAGNOSIS — K8020 Calculus of gallbladder without cholecystitis without obstruction: Secondary | ICD-10-CM | POA: Insufficient documentation

## 2025-01-11 LAB — CBC AND DIFFERENTIAL
HCT: 41 (ref 36–46)
Hemoglobin: 12.8 (ref 12.0–16.0)
Platelets: 303 10*3/uL (ref 150–400)
WBC: 6.2

## 2025-01-11 LAB — CBC: RBC: 4.3 (ref 3.87–5.11)

## 2025-01-11 NOTE — Discharge Summary (Signed)
 [facility] Discharge Summary  PCP: Aleene VEAR Hockey, MD Discharge Details   Admit date:         01/09/2025 Discharge date:        01/11/2025  Hospital LOS:    2 days DIscharge Disposition:  home   Active Hospital Problems   Diagnosis Date Noted POA   *Duodenitis 01/09/2025 Yes   Transaminitis 01/09/2025 Yes   Hyperbilirubinemia 01/09/2025 Yes    Resolved Hospital Problems  No resolved problems to display.      Current Discharge Medication List     START taking these medications      Details  ondansetron  4 mg tablet Commonly known as: ZOFRAN   Take one tablet (4 mg dose) by mouth every 4 (four) hours as needed for up to 7 days. Quantity: 20 tablet   oxyCODONE HCl 5 mg immediate release tablet Commonly known as: ROXICODONE  Take one tablet (5 mg dose) by mouth every 6 (six) hours as needed for Pain for up to 3 days. Max Daily Amount: 20 mg Quantity: 12 tablet   pantoprazole sodium 40 mg tablet Commonly known as: PROTONIX  Take one tablet (40 mg dose) by mouth 2 (two) times daily. Quantity: 60 tablet       CONTINUE these medications which have NOT CHANGED      Details  amlodipine -benazepril  5-10 MG per capsule Commonly known as: LOTREL  SMARTSIG:Capsule(s) By Mouth   ergocalciferol  50,000 units Caps capsule Commonly known as: Vitamin D2  Take one capsule (50,000 Units dose) by mouth once a week.   esomeprazole magnesium 20 MG packet Commonly known as: NEXIUM  Take twenty mg by mouth 1 (one) time each day before breakfast.      * You might also be taking other medications not listed above. If you have questions about any of your other medications, talk to the person who prescribed them or your Primary Care Provider.           Reason for Medication Changes: Cholelithiasis  Hospital Course  Physicians involved in care during this hospitalization Attending Provider: Sydell DELENA Sartorius, MD Attending Provider: Darleene Norleen Railing, MD Attending Provider: Deedee Flatten, MD Admitting Provider: Darleene Norleen Railing, MD Consulting Physician: Onetha JINNY Larger, MD Anesthesiologist: Asberry Misty Golas, MD Consulting Physician: Bethann Nettles, MD  Indication for Admission: duodenitis versus peptic ulcer disease   History of Present Illness as per H&P:  [name] is a 54 y.o. female who presented to the ED with complaints of upper abdominal pain along with nausea and vomiting.  Her symptoms started a couple weeks ago but have been worse over the past 3 days.  Her pain has been mainly in the epigastrium but also in the right upper quadrant.  She denies any history of issues with her gallbladder.  Patient was previously on Ozempic for about a year and did occasionally have some nausea but no severe symptoms.  She was then off of the Ozempic for a while and recently restarted back on it at the starting dose of 0.25 mg weekly.  She has taken 2 doses after getting back on it.   In the emergency department patient was noted to have white blood cell count of 7000 with AST of 41 and ALT of 657.  Alk phos was 237 with bilirubin of 4.  Lipase of 79.  CT of the abdomen pelvis showed concerns for duodenitis versus gastroduodenitis.  There were no gallbladder issues identified on CT scan.  Right upper quadrant ultrasound showed some mild Perry cholecystic fluid with gallbladder wall thickening but Murphy sign was negative.  Emergency department provider spoke with Dr. Daril who recommended MRCP.  Hospital Course:        *Duodenitis vs PUD    Right upper quadrant abdominal pain, nausea, vomiting    Cholelithiasis   Transaminitis   Hyperbilirubinemia MRCP shows No findings explain etiology of patient's elevated LFTs. No biliary dilatation. Dilated left renal pelvis containing a stone. Cholelithiasis. Complicated splenic cyst, most likely benign. Follow-up can be obtained in one year to document stability.  GI and general surgery  were both consulted.  Patient underwent endoscopy with GI and was found to have Antral nodule;Ulcerative esophagitis;Medium sized hiatal hernia;NO evidence of a peptic ulcer in the stomach or duodenum.  Patient is discharged with Protonix twice a day aiming for a month course and follow-up with GI as outpatient. General surgery has scheduled patient for cholecystectomy next Wednesday 01/16/2025. Need to stop semaglutide.  Alpha-1 antitrypsin, autoimmune and hepatitis A pending final result will need to be follow-up as outpatient.      Bedside Procedures   No orders found     [facility] Care   Discharge Procedure Orders  Follow-up with Primary Care Physician  Standing Status: Future  Referral Priority: Routine Referral Type: Consultation  Referral Reason: Evaluate and Return  Number of Visits Requested: 1 Expiration Date: 07/09/25   Ambulatory referral to Gastroenterology  Standing Status: Future  Referral Priority: Routine Referral Type: Consultation  Referral Reason: Specialty Services Required  Requested Specialty: Gastroenterology  Number of Visits Requested: 1 Expiration Date: 07/09/25   Ambulatory referral to General Surgery  Standing Status: Future  Referral Priority: Routine Referral Type: General Surgery  Referral Reason: Evaluate and Return  Requested Specialty: General Surgery  Number of Visits Requested: 1 Expiration Date: 07/09/25   Notify Physician for trouble breathing or symptoms that are worse   Notify physician - Call your doctor if you experience nausea/vomiting   Notify physician - Contact your doctor for excessive bleeding   Notify Physician and CALL 911 if you experience Chest Pain or discomfort, especially if associated with tiredness, sick on stomach or sweaty feeling.   Notify Physician and Call 911 if you experience any of the following: Sudden numbness or weakness, sudden trouble speaking, vision problems, trouble walking, sudden severe  headache with no known cause.   Activity as tolerated   Discharge instructions  Order Comments: It was a pleasure caring for you during your hospital stay. We are glad you are doing better and are able to go home.   Please take the medication as instructed.   Please follow up with your Family Doctor within one week.  Follow-up with GI and general surgery as scheduled.  Return to the hospital if you have fevers greater than 101 degrees not controlled with Tylenol , difficulty breathing, chest pain, palpitations, dizziness or fainting, or for any other concerning symptoms.    Unresulted [employer] Current Status   Alpha-1-Antitrypsin Deficiency In process   Anti-Smooth Muscle Antibody In process   Hepatitis A Antibody, IgM In process   Mitochondrial Antibodies, M2 In process   Pathology Tissue Request In process         Recommendations to physicians:   Followup PCP 1 week  Followup with GI and general surgery as scheduled  Recommendations for followup labs and/or imaging: LFT  Code Status:   Full Code   Time spent in  discharge process:  35 minutes  This note was dictated with voice recognition software. Similar sounding words can inadvertently be transcribed and may not be corrected upon review  Electronically signed: Deedee Flatten, MD 01/11/2025 / 3:15 PM *Some images could not be shown.

## 2025-01-16 LAB — BASIC METABOLIC PANEL WITH GFR
BUN: 13 (ref 4–21)
CO2: 24 — AB (ref 13–22)
Chloride: 101 (ref 99–108)
Creatinine: 0.9 (ref 0.5–1.1)
Glucose: 101
Potassium: 4.1 meq/L (ref 3.5–5.1)
Sodium: 136 — AB (ref 137–147)

## 2025-01-16 LAB — COMPREHENSIVE METABOLIC PANEL WITH GFR
Albumin: 4.3 (ref 3.5–5.0)
Calcium: 8.7 (ref 8.7–10.7)
Globulin: 3.2
eGFR: 80

## 2025-01-23 ENCOUNTER — Ambulatory Visit: Admitting: Family Medicine

## 2025-01-23 ENCOUNTER — Encounter: Payer: Self-pay | Admitting: Family Medicine

## 2025-01-23 VITALS — BP 130/82 | HR 91 | Temp 97.5°F | Ht 64.0 in | Wt 177.0 lb

## 2025-01-23 DIAGNOSIS — R7401 Elevation of levels of liver transaminase levels: Secondary | ICD-10-CM

## 2025-01-23 DIAGNOSIS — R1012 Left upper quadrant pain: Secondary | ICD-10-CM

## 2025-01-23 DIAGNOSIS — Z9049 Acquired absence of other specified parts of digestive tract: Secondary | ICD-10-CM

## 2025-01-23 DIAGNOSIS — I10 Essential (primary) hypertension: Secondary | ICD-10-CM

## 2025-01-23 DIAGNOSIS — R1011 Right upper quadrant pain: Secondary | ICD-10-CM

## 2025-01-23 MED ORDER — AMLODIPINE BESY-BENAZEPRIL HCL 5-10 MG PO CAPS: ORAL_CAPSULE | ORAL | 3 refills | Status: AC

## 2025-01-23 MED ORDER — OXYCODONE HCL 5 MG PO TABS: ORAL_TABLET | ORAL | 0 refills | Status: DC

## 2025-01-23 MED ORDER — OXYCODONE HCL 5 MG PO TABS: ORAL_TABLET | ORAL | 0 refills | Status: AC

## 2025-01-23 NOTE — Progress Notes (Signed)
 OFFICE VISIT  01/23/2025  CC:  Chief Complaint  Patient presents with   Medical Management of Chronic Issues    1 month f/u HTN    Patient is a 54 y.o. female who presents for 1 month follow-up of hypertension. A/P as of last visit: #1 uncontrolled hypertension, improving since getting on amlodipine -benazepril  5-10, 1 Daily.  Will increase to 1 Twice daily now. Metabolic panel today.   2.  Elevated LFTs. Possibly due to excessive Tylenol  intake plus minus some alcohol intake.  Will recheck today and do viral hepatitis serology for completeness. She may have some metabolic dysfunction associated fatty liver (triglycerides were mildly elevated last visit as well).   #3 overweight, BMI 29.  Weight management. She had been on Ozempic 0.5 mg weekly a little bit before she became my patient recently.  She got off of it because she had not been feeling well overall.  Now that she feels better and her blood pressure is better she we will go ahead and get on 0.5 mg of Ozempic weekly again.   #4 vitamin D  deficiency. She will start 1000 unit vitamin D  tab daily. We will recheck vitamin D  level in a few months.  INTERIM HX:  Severe acute epigastric and RUQ pain 01/09/25-->[facility] admission, GB removed but not clear cause of elev alk phos, AST, ALT, and bilirubin.  Labs normalized by the time of discharge. CT abd->? Gastroduodenitis.   RUQ u/s->Some GB wall thickening and pericholecystic fluid for entheses Path showed chronic cholecystitis with cholelithiasis without acute cholecystitis. MRCP-->cholelithiasis but no findings to explain lab abnormalities. EGD showed erosive esophagitis, mod size HH, no gastritis or duodenitis. ? Lab elevations d/t ozempic?  Since going home she does have significant pain in the left upper quadrant at the location of her laparoscopy wound. She has no nausea or vomiting.  No fever. She followed up at her [facility] office yesterday and was reassured.  She says  pantoprazole 40 mg twice daily is helping very much.  Review of systems: No abnormal weight loss or gain. No headaches, visual abnormalities, lower extremity swelling, chest pain, shortness of breath, or dizziness.  No alcohol.  No recent Tylenol .  Past Medical History:  Diagnosis Date   Elevated transaminase level    11/2024.  Viral hep serol NEG   GERD (gastroesophageal reflux disease)    History of gestational diabetes    Hyperparathyroidism    question of.  Mild PTH elev 2013 (but Ca++ normal)-->SPECT neg for adenoma, MRI neck NEG   Hypertension    Personal history of urinary calculi    hx bilat nephrolithotomy   Vitamin D  deficiency     Past Surgical History:  Procedure Laterality Date   BREAST ENHANCEMENT SURGERY  2003   COLONOSCOPY  2009   CYSTO/ BILATERAL URETERAL STENT PLACEMENT  08/05/2011   lithothripsy     PERCUTANEOUS NEPHROSTOLITHOTOMY  09/03/2011   RIGHT   PERCUTANOUS NEPHROSTOLITHOTOMY  10/08/2011   LEFT   RENAL ENDOSCOPY VIA NEPHROSTOMY / PYELOSTOMY     REPEAT CESAREAN SECTION  04/13/2002   W/ TUBAL LIGATION   RIGHT URETEROSCOPIC STONE EXTRACTION  11/26/2005    Outpatient Medications Prior to Visit  Medication Sig Dispense Refill   amLODipine -benazepril  (LOTREL) 5-10 MG capsule 1 cap po bid 60 capsule 0   estradiol (ESTRACE) 0.01 % CREA vaginal cream Place 1 Applicatorful vaginally.     Hyoscyamine Sulfate SL 0.125 MG SUBL Place 0.125 mg under the tongue every 6 (six) hours as needed (  gas pains/bloating).     levonorgestrel (MIRENA, 52 MG,) 20 MCG/DAY IUD Mirena 20 mcg/24 hours (5 yrs) 52 mg intrauterine device Take 1 device by intrauterine route.     methocarbamol (ROBAXIN) 500 MG tablet Take 500 mg by mouth 4 (four) times daily.     pantoprazole (PROTONIX) 40 MG tablet Take 40 mg by mouth 2 (two) times daily.     polyethylene glycol powder (GLYCOLAX/MIRALAX) 17 GM/SCOOP powder Take 17 g by mouth daily.     Semaglutide,0.25 or 0.5MG /DOS, 2 MG/1.5ML SOPN  INJECT (10 UNITS ON INSULIN SYRINGE) UNDER THE SKIN ONCE WEEKLY. DO NOT GO UP WITHOUT SPEAKING TO THE PROVIDER. (Patient not taking: Reported on 01/23/2025)     esomeprazole (NEXIUM) 20 MG packet Take 20 mg by mouth daily before breakfast.     No facility-administered medications prior to visit.    Allergies[1]  Review of Systems As per HPI  PE:    01/23/2025    3:29 PM 12/27/2024    8:32 AM 12/17/2024    1:17 PM  Vitals with BMI  Height 5' 4 5' 4 5' 4  Weight 177 lbs 172 lbs 10 oz 171 lbs  BMI 30.37 29.61 29.34  Systolic 130 136 862  Diastolic 82 86 90  Pulse 91 100 85     Physical Exam  Exam chaperoned by [employer], CMA Gen: Alert, well appearing.  Patient is oriented to person, place, time, and situation. ABD: soft, nondistended.  She has 3 small laparoscopy wounds in the upper abdomen (right upper quadrant, midepigastric, and left upper quadrant) with wound edges well-approximated.  No erythema.  There is slight bruising around each wound.  No warmth or nodularity or fluctuance.  LABS:  Last CBC Lab Results  Component Value Date   WBC 3.9 (L) 12/17/2024   HGB 14.2 12/17/2024   HCT 42.4 12/17/2024   MCV 91.5 12/17/2024   MCH 30.8 06/05/2015   RDW 13.3 12/17/2024   PLT 245.0 12/17/2024   Last metabolic panel Lab Results  Component Value Date   GLUCOSE 141 (H) 12/27/2024   NA 137 12/27/2024   K 4.2 12/27/2024   CL 103 12/27/2024   CO2 26 12/27/2024   BUN 20 12/27/2024   CREATININE 0.94 12/27/2024   GFR 69.92 12/27/2024   CALCIUM 8.9 12/27/2024   PROT 7.1 12/27/2024   ALBUMIN 4.2 12/27/2024   BILITOT 0.9 12/27/2024   ALKPHOS 85 12/27/2024   AST 32 12/27/2024   ALT 57 (H) 12/27/2024   ANIONGAP 8 06/05/2015   Last lipids Lab Results  Component Value Date   CHOL 166 12/17/2024   HDL 35.50 (L) 12/17/2024   LDLCALC 91 12/17/2024   TRIG 201.0 (H) 12/17/2024   CHOLHDL 5 12/17/2024   Last hemoglobin A1c Lab Results  Component Value Date   HGBA1C  5.6 12/17/2024   Last vitamin D  Lab Results  Component Value Date   VD25OH 22.19 (L) 12/17/2024   IMPRESSION AND PLAN:  #1 hypertension.  Well-controlled on Lotrel 5-10 mg, 1 Capsule twice daily. Electrolytes and creatinine recently when in the hospital normal.  #2 GERD, with esophagitis on EGD. Much improved on pantoprazole 40 mg twice a day.  She will continue this dosing at least until her follow-up with gastroenterology.  3.  Acute right upper quadrant pain.  She had labs consistent with acute biliary obstruction but her imaging did not support this. She ended up getting her gallbladder out, chronic cholecystitis on path. No recurrence of symptoms since  discharge home. It is very possible that this was all due to Ozempic, which she had recently restarted prior to her pain. Monitor hepatic panel today.  #4 elevated transaminases. Recently she also had elevation of alkaline phosphatase and bilirubin.  Lipase was just slightly elevated. See #3 above. Alkaline phosphatase and bilirubin normalized prior to discharge. 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 Hepatic panel today. She will be following up with GI.  #5 postoperative left upper quadrant pain. This is focally at the site of her laparoscopy wound.  This does not appear infected and there does not appear to be any seroma or hematoma. Will prescribe oxycodone  to use short-term, 5 mg tabs, 1-2 tid  as needed, #30.  An After Visit Summary was printed and given to the patient.  FOLLOW UP: No follow-ups on file.  Signed:  Gerlene Hockey, MD           01/23/2025     [1] No Known Allergies

## 2025-01-24 ENCOUNTER — Encounter: Payer: Self-pay | Admitting: Family Medicine

## 2025-01-24 LAB — HEPATIC FUNCTION PANEL
ALT: 53 U/L — ABNORMAL HIGH (ref 3–35)
AST: 31 U/L (ref 5–37)
Albumin: 4.1 g/dL (ref 3.5–5.2)
Alkaline Phosphatase: 105 U/L (ref 39–117)
Bilirubin, Direct: 0.3 mg/dL (ref 0.1–0.3)
Total Bilirubin: 0.8 mg/dL (ref 0.2–1.2)
Total Protein: 6.9 g/dL (ref 6.0–8.3)

## 2025-01-25 ENCOUNTER — Ambulatory Visit: Payer: Self-pay | Admitting: Family Medicine

## 2025-01-25 NOTE — Telephone Encounter (Signed)
 FYI reviewed

## 2025-01-25 NOTE — Telephone Encounter (Signed)
 Ok. What was her first day out of work (the form will require this date as well as the expected return to work date)?

## 2025-01-25 NOTE — Telephone Encounter (Signed)
 Sending as FYI

## 2025-02-01 ENCOUNTER — Ambulatory Visit: Admitting: Family Medicine
